# Patient Record
Sex: Female | Born: 1970 | Hispanic: Yes | Marital: Married | Smoking: Never smoker
Health system: Southern US, Community
[De-identification: ages and names within clinical notes are randomized; demographics above are authoritative.]

## PROBLEM LIST (undated history)

## (undated) DIAGNOSIS — E782 Mixed hyperlipidemia: Secondary | ICD-10-CM

## (undated) DIAGNOSIS — I10 Essential (primary) hypertension: Secondary | ICD-10-CM

## (undated) DIAGNOSIS — E119 Type 2 diabetes mellitus without complications: Secondary | ICD-10-CM

## (undated) DIAGNOSIS — E7800 Pure hypercholesterolemia, unspecified: Secondary | ICD-10-CM

## (undated) HISTORY — DX: Type 2 diabetes mellitus without complications: E11.9

## (undated) HISTORY — DX: Essential (primary) hypertension: I10

## (undated) HISTORY — DX: Mixed hyperlipidemia: E78.2

---

## 2013-02-17 ENCOUNTER — Emergency Department: Payer: Self-pay | Admitting: Emergency Medicine

## 2013-02-17 LAB — CBC WITH DIFFERENTIAL/PLATELET
Basophil #: 0.1 10*3/uL (ref 0.0–0.1)
Basophil %: 1.1 %
Eosinophil #: 0.2 10*3/uL (ref 0.0–0.7)
Eosinophil %: 3 %
HGB: 12.7 g/dL (ref 12.0–16.0)
Lymphocyte #: 2.2 10*3/uL (ref 1.0–3.6)
MCH: 27.5 pg (ref 26.0–34.0)
MCHC: 33.7 g/dL (ref 32.0–36.0)
MCV: 82 fL (ref 80–100)
Monocyte #: 0.6 x10 3/mm (ref 0.2–0.9)
Monocyte %: 8.1 %
Neutrophil %: 56.4 %
RBC: 4.63 10*6/uL (ref 3.80–5.20)
RDW: 13.6 % (ref 11.5–14.5)
WBC: 7.1 10*3/uL (ref 3.6–11.0)

## 2013-02-17 LAB — COMPREHENSIVE METABOLIC PANEL
BUN: 12 mg/dL (ref 7–18)
Co2: 28 mmol/L (ref 21–32)
Creatinine: 0.58 mg/dL — ABNORMAL LOW (ref 0.60–1.30)
EGFR (Non-African Amer.): >60
Glucose: 119 mg/dL — ABNORMAL HIGH (ref 65–99)
Osmolality: 275 (ref 275–301)
Potassium: 3.4 mmol/L — ABNORMAL LOW (ref 3.5–5.1)

## 2013-02-17 LAB — TROPONIN I: Troponin-I: <0.02 ng/mL

## 2013-02-17 LAB — CK TOTAL AND CKMB (NOT AT ARMC): CK, Total: 124 U/L (ref 21–215)

## 2013-11-20 ENCOUNTER — Other Ambulatory Visit (HOSPITAL_COMMUNITY): Payer: Self-pay | Admitting: Nurse Practitioner

## 2013-11-20 DIAGNOSIS — N6310 Unspecified lump in the right breast, unspecified quadrant: Secondary | ICD-10-CM

## 2013-12-03 ENCOUNTER — Ambulatory Visit (HOSPITAL_COMMUNITY)
Admission: RE | Admit: 2013-12-03 | Discharge: 2013-12-03 | Disposition: A | Discharge status: Home / Self Care | Payer: PRIVATE HEALTH INSURANCE | Source: Ambulatory Visit | Attending: Nurse Practitioner | Admitting: Nurse Practitioner

## 2013-12-03 ENCOUNTER — Other Ambulatory Visit (HOSPITAL_COMMUNITY): Payer: Self-pay | Admitting: Nurse Practitioner

## 2013-12-03 DIAGNOSIS — N6489 Other specified disorders of breast: Secondary | ICD-10-CM | POA: Insufficient documentation

## 2013-12-03 DIAGNOSIS — N63 Unspecified lump in unspecified breast: Secondary | ICD-10-CM | POA: Insufficient documentation

## 2013-12-03 DIAGNOSIS — N6310 Unspecified lump in the right breast, unspecified quadrant: Secondary | ICD-10-CM

## 2014-05-04 ENCOUNTER — Other Ambulatory Visit (HOSPITAL_COMMUNITY): Payer: Self-pay | Admitting: Nurse Practitioner

## 2014-05-04 DIAGNOSIS — N6310 Unspecified lump in the right breast, unspecified quadrant: Secondary | ICD-10-CM

## 2014-05-04 DIAGNOSIS — R928 Other abnormal and inconclusive findings on diagnostic imaging of breast: Secondary | ICD-10-CM

## 2014-06-09 ENCOUNTER — Ambulatory Visit (HOSPITAL_COMMUNITY)
Admission: RE | Admit: 2014-06-09 | Discharge: 2014-06-09 | Disposition: A | Discharge status: Home / Self Care | Payer: PRIVATE HEALTH INSURANCE | Source: Ambulatory Visit | Attending: Nurse Practitioner | Admitting: Nurse Practitioner

## 2014-06-09 ENCOUNTER — Other Ambulatory Visit (HOSPITAL_COMMUNITY): Payer: Self-pay | Admitting: Nurse Practitioner

## 2014-06-09 DIAGNOSIS — N6310 Unspecified lump in the right breast, unspecified quadrant: Secondary | ICD-10-CM

## 2014-06-09 DIAGNOSIS — N63 Unspecified lump in unspecified breast: Secondary | ICD-10-CM | POA: Insufficient documentation

## 2014-06-09 DIAGNOSIS — R928 Other abnormal and inconclusive findings on diagnostic imaging of breast: Secondary | ICD-10-CM

## 2014-12-08 ENCOUNTER — Other Ambulatory Visit (HOSPITAL_COMMUNITY): Payer: Self-pay | Admitting: *Deleted

## 2014-12-08 DIAGNOSIS — N6320 Unspecified lump in the left breast, unspecified quadrant: Secondary | ICD-10-CM

## 2014-12-22 ENCOUNTER — Ambulatory Visit (HOSPITAL_COMMUNITY)
Admission: RE | Admit: 2014-12-22 | Discharge: 2014-12-22 | Disposition: A | Discharge status: Home / Self Care | Payer: PRIVATE HEALTH INSURANCE | Source: Ambulatory Visit | Attending: *Deleted | Admitting: *Deleted

## 2014-12-22 ENCOUNTER — Other Ambulatory Visit (HOSPITAL_COMMUNITY): Payer: Self-pay | Admitting: *Deleted

## 2014-12-22 DIAGNOSIS — N6320 Unspecified lump in the left breast, unspecified quadrant: Secondary | ICD-10-CM

## 2014-12-22 DIAGNOSIS — N63 Unspecified lump in breast: Secondary | ICD-10-CM | POA: Insufficient documentation

## 2015-06-24 ENCOUNTER — Other Ambulatory Visit (HOSPITAL_COMMUNITY): Payer: Self-pay | Admitting: *Deleted

## 2015-06-24 DIAGNOSIS — Z09 Encounter for follow-up examination after completed treatment for conditions other than malignant neoplasm: Secondary | ICD-10-CM

## 2015-06-24 DIAGNOSIS — R928 Other abnormal and inconclusive findings on diagnostic imaging of breast: Secondary | ICD-10-CM

## 2015-07-06 ENCOUNTER — Ambulatory Visit (HOSPITAL_COMMUNITY)
Admission: RE | Admit: 2015-07-06 | Discharge: 2015-07-06 | Disposition: A | Discharge status: Home / Self Care | Payer: PRIVATE HEALTH INSURANCE | Source: Ambulatory Visit | Attending: *Deleted | Admitting: *Deleted

## 2015-07-06 DIAGNOSIS — Z09 Encounter for follow-up examination after completed treatment for conditions other than malignant neoplasm: Secondary | ICD-10-CM

## 2015-07-06 DIAGNOSIS — D242 Benign neoplasm of left breast: Secondary | ICD-10-CM | POA: Insufficient documentation

## 2015-07-06 DIAGNOSIS — R928 Other abnormal and inconclusive findings on diagnostic imaging of breast: Secondary | ICD-10-CM

## 2016-02-01 ENCOUNTER — Other Ambulatory Visit (HOSPITAL_COMMUNITY): Payer: Self-pay | Admitting: *Deleted

## 2016-02-01 DIAGNOSIS — Z09 Encounter for follow-up examination after completed treatment for conditions other than malignant neoplasm: Secondary | ICD-10-CM

## 2016-02-01 DIAGNOSIS — N6320 Unspecified lump in the left breast, unspecified quadrant: Secondary | ICD-10-CM

## 2016-02-15 ENCOUNTER — Ambulatory Visit (HOSPITAL_COMMUNITY)
Admission: RE | Admit: 2016-02-15 | Discharge: 2016-02-15 | Disposition: A | Discharge status: Home / Self Care | Payer: PRIVATE HEALTH INSURANCE | Source: Ambulatory Visit | Attending: *Deleted | Admitting: *Deleted

## 2016-02-15 ENCOUNTER — Other Ambulatory Visit: Payer: Self-pay | Admitting: Diagnostic Radiology

## 2016-02-15 DIAGNOSIS — N6320 Unspecified lump in the left breast, unspecified quadrant: Secondary | ICD-10-CM

## 2016-02-15 DIAGNOSIS — N63 Unspecified lump in breast: Secondary | ICD-10-CM | POA: Insufficient documentation

## 2016-02-15 DIAGNOSIS — Z09 Encounter for follow-up examination after completed treatment for conditions other than malignant neoplasm: Secondary | ICD-10-CM

## 2016-02-15 DIAGNOSIS — R928 Other abnormal and inconclusive findings on diagnostic imaging of breast: Secondary | ICD-10-CM

## 2016-02-16 ENCOUNTER — Other Ambulatory Visit: Payer: Self-pay | Admitting: *Deleted

## 2016-02-16 DIAGNOSIS — R928 Other abnormal and inconclusive findings on diagnostic imaging of breast: Secondary | ICD-10-CM

## 2016-02-18 ENCOUNTER — Ambulatory Visit
Admission: RE | Admit: 2016-02-18 | Discharge: 2016-02-18 | Disposition: A | Discharge status: Home / Self Care | Payer: No Typology Code available for payment source | Source: Ambulatory Visit | Attending: Diagnostic Radiology | Admitting: Diagnostic Radiology

## 2016-02-18 ENCOUNTER — Ambulatory Visit
Admission: RE | Admit: 2016-02-18 | Discharge: 2016-02-18 | Disposition: A | Discharge status: Home / Self Care | Payer: No Typology Code available for payment source | Source: Ambulatory Visit | Attending: *Deleted | Admitting: *Deleted

## 2016-02-18 ENCOUNTER — Other Ambulatory Visit: Payer: Self-pay | Admitting: *Deleted

## 2016-02-18 DIAGNOSIS — R928 Other abnormal and inconclusive findings on diagnostic imaging of breast: Secondary | ICD-10-CM

## 2016-03-10 ENCOUNTER — Other Ambulatory Visit (HOSPITAL_COMMUNITY): Payer: Self-pay | Admitting: Orthopedic Surgery

## 2016-03-10 DIAGNOSIS — D499 Neoplasm of unspecified behavior of unspecified site: Secondary | ICD-10-CM

## 2016-03-13 NOTE — H&P (Signed)
  NTS SOAP Note  Vital Signs:  Vitals as of: [id number]: Systolic Q000111Q: Diastolic 89: Heart Rate 76: Temp 97.49F (Temporal): Height 70ft 3.5in: Weight 161Lbs 0 Ounces: BMI 28.07   BMI : 28.07 kg/m2  Subjective: This 45 year old female presents for of a left breast mass/microcalcifications.  Seen on screening mammmogram, core biopsy shows sclerosing lesion.  Removal is recommended.  Patient denies feeling a lump.  No nipple discharge, family h/o breast cancer.  Does understand some [place], daughter present with her.  Review of Symptoms:  Constitutional:negative Head:negative Eyes:negative Nose/Mouth/Throat:negative Cardiovascular:negative Respiratory:negative Gastrointestinnegative Genitourinary:negative Musculoskeletal:negative Skin:negative Breast:negative Hematolgic/Lymphatic:negative Allergic/Immunologic:negative   Past Medical History:Reviewed  Past Medical History  Surgical History: none Medical Problems: NIDDM, HTN Allergies: nkda Medications: metformin, lisinopril, atenolol   Social History:Reviewed  Social History  Preferred Language: Other Race:  Other Ethnicity: Not Hispanic / Latino Age: 89 year Marital Status:  S Alcohol: no   Smoking Status: Never smoker reviewed on 03/09/2016 Functional Status reviewed on 03/09/2016 ------------------------------------------------ Bathing: Normal Cooking: Normal Dressing: Normal Driving: Normal Eating: Normal Managing Meds: Normal Oral Care: Normal Shopping: Normal Toileting: Normal Transferring: Normal Walking: Normal Cognitive Status reviewed on 03/09/2016 ------------------------------------------------ Attention: Normal Decision Making: Normal Language: Normal Memory: Normal Motor: Normal Perception: Normal Problem Solving: Normal Visual and Spatial: Normal   Family History:Reviewed  Family Health History Mother, Living; Hypertension (high blood pressure);  Father,  Living; Hypertension (high blood pressure);     Objective Information: General:Well appearing, well nourished in no distress. Skin:no rash or prominent lesions Head:Atraumatic; no masses; no abnormalities Neck:Supple without lymphadenopathy.  Heart:RRR, no murmur or gallop.  Normal S1, S2.  No S3, S4.  Lungs:CTA bilaterally, no wheezes, rhonchi, rales.  Breathing unlabored. No dominant mass, nipple discharge, dimpling in either breast.  Axillas negative for palpable nodes. Abdomen:Soft, NT/ND, no HSM, no masses. negative  Mammogram and path reports reviewed.  Discussed with Dr. Saralyn Pilar, pathology Assessment:Left breast calcifications, uncertain behavior  Diagnoses: 793.89  R92.1 Mammographic calcification of breast (Mammographic calcification found on diagnostic imaging of breast)  Procedures: [id number] - OFFICE OUTPATIENT NEW 30 MINUTES    Plan:  Scheduled for left breast biopsy after needle localization on 03/22/16.   Patient Education:Alternative treatments to surgery were discussed with patient (and family).Risks and benefits  of procedure were fully explained to the patient (and family) who gave informed consent. Patient/family questions were addressed.  Follow-up:Pending Surgery

## 2016-03-15 NOTE — Patient Instructions (Addendum)
Chelsea Massey  03/15/2016     @PREFPERIOPPHARMACY @   Your procedure is scheduled on 03/22/2016.  Report to [facility] at 8:30 A.M.  Call this number if you have problems the morning of surgery:  [phone]   Remember:  Do not eat food or drink liquids after midnight.  Take these medicines the morning of surgery with A SIP OF WATER : ATENOLOL AND LISINOPRIL   Do not wear jewelry, make-up or nail polish.  Do not wear lotions, powders, or perfumes.  You may wear deodorant.  Do not shave 48 hours prior to surgery.  Men may shave face and neck.  Do not bring valuables to the hospital.  [facility] is not responsible for any belongings or valuables.  Contacts, dentures or bridgework may not be worn into surgery.  Leave your suitcase in the car.  After surgery it may be brought to your room.  For patients admitted to the hospital, discharge time will be determined by your treatment team.  Patients discharged the day of surgery will not be allowed to drive home.   Name and phone number of your driver:   FAMILY Special instructions:  N/A  Please read over the following fact sheets that you were given. Care and Recovery After Surgery    General Anesthesia, Adult General anesthesia is a sleep-like state of non-feeling produced by medicines (anesthetics). General anesthesia prevents you from being alert and feeling pain during a medical procedure. Your caregiver may recommend general anesthesia if your procedure:  Is long.  Is painful or uncomfortable.  Would be frightening to see or hear.  Requires you to be still.  Affects your breathing.  Causes significant blood loss. LET YOUR CAREGIVER KNOW ABOUT:  Allergies to food or medicine.  Medicines taken, including vitamins, herbs, eyedrops, over-the-counter medicines, and creams.  Use of steroids (by mouth or creams).  Previous problems with anesthetics or numbing medicines, including problems experienced by  relatives.  History of bleeding problems or blood clots.  Previous surgeries and types of anesthetics received.  Possibility of pregnancy, if this applies.  Use of cigarettes, alcohol, or illegal drugs.  Any health condition(s), especially diabetes, sleep apnea, and high blood pressure. RISKS AND COMPLICATIONS General anesthesia rarely causes complications. However, if complications do occur, they can be life threatening. Complications include:  A lung infection.  A stroke.  A heart attack.  Waking up during the procedure. When this occurs, the patient may be unable to move and communicate that he or she is awake. The patient may feel severe pain. Older adults and adults with serious medical problems are more likely to have complications than adults who are young and healthy. Some complications can be prevented by answering all of your caregiver's questions thoroughly and by following all pre-procedure instructions. It is important to tell your caregiver if any of the pre-procedure instructions, especially those related to diet, were not followed. Any food or liquid in the stomach can cause problems when you are under general anesthesia. BEFORE THE PROCEDURE  Ask your caregiver if you will have to spend the night at the hospital. If you will not have to spend the night, arrange to have an adult drive you and stay with you for 24 hours.  Follow your caregiver's instructions if you are taking dietary supplements or medicines. Your caregiver may tell you to stop taking them or to reduce your dosage.  Do not smoke for as long as possible before your procedure. If possible, stop  smoking 3-6 weeks before the procedure.  Do not take new dietary supplements or medicines within 1 week of your procedure unless your caregiver approves them.  Do not eat within 8 hours of your procedure or as directed by your caregiver. Drink only clear liquids, such as water, black coffee (without milk or cream),  and fruit juices (without pulp).  Do not drink within 3 hours of your procedure or as directed by your caregiver.  You may brush your teeth on the morning of the procedure, but make sure to spit out the toothpaste and water when finished. PROCEDURE  You will receive anesthetics through a mask, through an intravenous (IV) access tube, or through both. A doctor who specializes in anesthesia (anesthesiologist) or a nurse who specializes in anesthesia (nurse anesthetist) or both will stay with you throughout the procedure to make sure you remain unconscious. He or she will also watch your blood pressure, pulse, and oxygen levels to make sure that the anesthetics do not cause any problems. Once you are asleep, a breathing tube or mask may be used to help you breathe. AFTER THE PROCEDURE You will wake up after the procedure is complete. You may be in the room where the procedure was performed or in a recovery area. You may have a sore throat if a breathing tube was used. You may also feel:  Dizzy.  Weak.  Drowsy.  Confused.  Nauseous.  Cold. These are all normal responses and can be expected to last for up to 24 hours after the procedure is complete. A caregiver will tell you when you are ready to go home. This will usually be when you are fully awake and in stable condition.   This information is not intended to replace advice given to you by your health care provider. Make sure you discuss any questions you have with your health care provider.   Document Released: 01/02/2008 Document Revised: 10/16/2014 Document Reviewed: 01/24/2012 Elsevier Interactive Patient Education 2016 [place] (Breast Biopsy) Una biopsia de mama es un procedimiento en el cual se extrae Truddie Coco de tejido de la mama. El tejido se examina bajo el microscopio para determinar si hay clulas cancerosas. Una biopsia de mama se realiza cuando hay:  Un bulto en la mama no diagnosticado  (tumor).  Anormalidades, hundimientos, costras o ulceraciones en el pezn.  Secrecin anormal del pezn, Dealer.  Enrojecimiento, hinchazn y [place].  Depsitos de calcio (calcificaciones) o anormalidades observadas en Lavinia Sharps, ecografa, o en la resonancia magntica (IRM).  Cambios sospechosos en la mama que se observan en la mamografa. Si se descubre que el tumor es canceroso (maligno) una biopsia de mama puede ayudar a Teacher, adult education cul es el mejor tratamiento para usted. Hay diferentes tipos de biopsia de mama. Hable con su mdico acerca de las opciones y cul es la mejor para usted.  INFORME A SU MDICO SOBRE:   Alergias a alimentos o medicamentos.  Medicamentos que [place], incluyendo vitaminas, hierbas, gotas oftlmicas, medicamentos de venta libre y cremas.  Uso de corticoides (por va oral o cremas).  Problemas anteriores debido a anestsicos o a medicamentos que [employer] sensibilidad.  Antecedentes de hemorragias o cogulos sanguneos.  Cirugas anteriores.  Otros problemas de salud, incluyendo diabetes y problemas renales.  Resfros o infecciones recientes.  Posibilidad de embarazo, si corresponde. [place].  Infeccin.  Reaccin alrgica a los medicamentos.  Hematomas e inflamacin de la mama.  Alteracin  en la forma de la mama.  No se encuentra el bulto o la anormalidad.  Necesidad de [place]. ANTES DEL PROCEDIMIENTO   Pdale a alguna persona que la lleve a su casa luego del procedimiento.  No fume al menos las 2 semanas previas al procedimiento. Si fuma, abandone el hbito.  No beba alcohol durante las 24 horas previas al procedimiento.  Use un buen sostn para el procedimiento.  El mdico puede realizar un procedimiento para Glass blower/designer un alambre (marcacin con Management consultant) o de una semilla que emite radiacin (marcacin con semilla radiactiva) en el ndulo mamario. Durante este  procedimiento, se realiza una mamografa o una ecografa para ayudar a Marine scientist. El alambre o la semilla ayudarn al mdico a Pension scheme manager el ndulo cuando realiza la biopsia, especialmente si este no se palpable. PROCEDIMIENTO  Le administrarn medicamentos para adormecer el rea de la mama (anestesia local) o medicamentos para dormir durante el procedimiento (anestesia general). Los siguientes son los diferentes tipos de biopsia que se pueden Optometrist.   Aspiracin con aguja fina: se inserta una aguja delgada con Clent Jacks en el tumor de la mama. Con ella se extraen lquido y clulas que luego se observan en el microscopio. Si el tumor no se palpa, se realizar una ecografa para localizar el tumor y Catering manager en el rea correcta.   Biopsia con aguja gruesa: Maxwell Caul de seccin amplia (aguja gruesa) se inserta en el tumor entre 3 y 36 veces para obtener muestras de tejido. Se toma la muestra de tejido. La aguja se Water quality scientist correcto mediante el uso de una ecografa o una radiografa.   Biopsia estereotctica: se utilizan equipos radiogrficos y Ardelia Mems computadora para Physiological scientist imgenes de la tumoracin [place]. La computadora encuentra exactamente el ncleo en el que se debe insertar la aguja. Se toma una muestra de tejido.   Biopsia asistida por vaco: se realiza una pequea incisin (menos de  de pulgada [0,6 cm] ) en la mama. Un equipo para biopsia que incluye una [place] y un suctor se pasan a travs de la incisin dentro del tejido [place]. El suctor suavemente drena tejido mamario anormal hacia la aguja para extraerlo. Este tipo de biopsia extrae una muestra mayor de tejido que aquel que se extrae habitualmente con la biopsia con Oletta Lamas. No se necesitan puntos de sutura y habitualmente deja una cicatriz pequea.  Biopsia con aguja gruesa guiada por ultrasonido-Un ultrasonido de alta frecuencia gua a la aguja gruesa al rea de la masa o anormalidad. Se  hace una incisin para insertar la aguja. Se toma una muestra de tejido.  Biopsia abierta: se hace una incisin grande en la mama. El mdico intentar extirpar todo el tumor de la mama o todo lo que pueda. DESPUS DEL PROCEDIMIENTO   La conducirn a la zona de recuperacin. Cuando se encuentre bien y no tenga problemas, podr volver a Medical illustrator.  Podr notar hematomas en la mama. Esto es normal.  El mdico puede aplicar un vendaje compresivo sobre la mama durante 24 - 46 horas. El vendaje compresivo se ajuste de manera apretada alrededor del trax para evitar que se acumule lquido debajo de los tejidos.   Esta informacin no tiene Marine scientist el consejo del mdico. Asegrese de hacerle al mdico cualquier pregunta que tenga.   Document Released: 07/05/2005 Document Revised: 06/16/2015 Elsevier Interactive Patient Education 2016 [employer]. Chlorhexidine topical antiseptic Qu es este medicamento? La CLORHEXIDINA se utiliza para la  limpieza de la piel con heridas y para la piel en general. Este medicamento se utiliza tambin como un limpiador para las manos antes de una operacin y para ayudar a prevenir infecciones. Este medicamento puede ser utilizado para otros usos; si tiene alguna pregunta consulte con su proveedor de atencin mdica o con su farmacutico. Qu le debo informar a mi profesional de la salud antes de tomar este medicamento? Necesita saber si usted presenta alguno de los siguientes problemas o situaciones -cualquier erupcin o problema cutneo -una reaccin alrgica o inusual a la clorhexidina, a otros medicamentos, alimentos, colorantes o conservantes -si est embarazada o buscando quedar embarazada -si est amamantando a un beb Cmo debo utilizar este medicamento? Este medicamento es slo para uso externo. No lo tome por va oral. Siga las instrucciones de la etiqueta del medicamento o como le haya indicado su mdico o su profesional de la salud. Mantngalo  fuera de los ojos, odos y [place]. Este medicamento no se debe usar como una preparacin de la piel antes de una operacin de la cara o la cabeza. Hable con su pediatra para informarse acerca del uso de este medicamento en nios. Aunque este medicamento se puede recetar para condiciones selectivas, las precauciones se aplican. Sobredosis: Pngase en contacto inmediatamente con un centro toxicolgico o una sala de urgencia si usted cree que haya tomado demasiado medicamento. ATENCIN: [employer] es solo para usted. No comparta este medicamento con nadie. Qu sucede si me olvido de una dosis? No se aplica en este caso. Este medicamento no es para uso regular. Qu puede interactuar con este medicamento? No se esperan interacciones. Puede ser que esta lista no menciona todas las posibles interacciones. Informe a su profesional de [employer]	[place] de [employer] productos a base de hierbas, medicamentos de [place] o suplementos nutritivos que est tomando. Si usted fuma, consume bebidas alcohlicas o si utiliza drogas ilegales, indqueselo tambin a su profesional de [employer]	[place]. Algunas sustancias pueden interactuar con su medicamento. A qu debo estar atento al usar [employer]? Este medicamento puede causarle reacciones alrgicas que pueden ser severas. Comunquese inmediatamente con su profesional de la salud si tiene Secondary school teacher. No tome este medicamento por va oral. Evite que el medicamento entre en contacto con sus odos y ojos. Si esto ocurre, enjuguelos con abundante agua fra del grifo. Qu efectos secundarios puedo tener al [employer] este medicamento? Efectos secundarios que debe informar a su mdico o a Office manager de la salud tan pronto como sea posible: -Chief of Staff como erupcin cutnea, picazn o urticarias, hinchazn de la cara, labios o lengua -problemas respiratorios -tos Efectos secundarios que, por lo general, no requieren atencin mdica (debe  informarlos a su mdico o a Office manager de la salud si persisten o si son molestos): -aumento de la sensibilidad al sol -irritacin de la piel Puede ser que esta lista no menciona todos los posibles efectos secundarios. Comunquese a su mdico por asesoramiento mdico [employer]. Usted puede informar los efectos secundarios a la FDA por telfono al 1-800-FDA-1088. Dnde debo guardar mi medicina? Mantenga el medicamento fuera del alcance de los nios. Gurdela a [employer], entre 15 y 26 grados C (26 y 98 grados F). Gurdela lejos de la luz directa y del Freight forwarder. No la congele. Deseche todo el medicamento que no haya utilizado, despus de la fecha de vencimiento. ATENCIN: Este folleto es un resumen. Puede ser que no cubra toda la posible informacin. Si usted tiene Engineer, drilling de  esta medicina, consulte con su mdico, su farmacutico o su profesional de [employer]	[place].    2016, Elsevier/Gold Standard. (2014-11-17 00:00:00)

## 2016-03-16 ENCOUNTER — Other Ambulatory Visit: Payer: Self-pay

## 2016-03-16 ENCOUNTER — Ambulatory Visit (HOSPITAL_COMMUNITY)
Admission: RE | Admit: 2016-03-16 | Discharge: 2016-03-16 | Disposition: A | Discharge status: Home / Self Care | Payer: PRIVATE HEALTH INSURANCE | Source: Ambulatory Visit | Attending: General Surgery | Admitting: General Surgery

## 2016-03-16 ENCOUNTER — Encounter (HOSPITAL_COMMUNITY): Payer: Self-pay

## 2016-03-16 ENCOUNTER — Encounter (HOSPITAL_COMMUNITY)
Admission: RE | Admit: 2016-03-16 | Discharge: 2016-03-16 | Disposition: A | Discharge status: Home / Self Care | Payer: PRIVATE HEALTH INSURANCE | Source: Ambulatory Visit | Attending: General Surgery | Admitting: General Surgery

## 2016-03-16 DIAGNOSIS — R918 Other nonspecific abnormal finding of lung field: Secondary | ICD-10-CM | POA: Insufficient documentation

## 2016-03-16 DIAGNOSIS — I10 Essential (primary) hypertension: Secondary | ICD-10-CM | POA: Insufficient documentation

## 2016-03-16 DIAGNOSIS — Z01818 Encounter for other preprocedural examination: Secondary | ICD-10-CM | POA: Insufficient documentation

## 2016-03-16 HISTORY — DX: Type 2 diabetes mellitus without complications: E11.9

## 2016-03-16 HISTORY — DX: Essential (primary) hypertension: I10

## 2016-03-16 HISTORY — DX: Pure hypercholesterolemia, unspecified: E78.00

## 2016-03-16 LAB — CBC WITH DIFFERENTIAL/PLATELET
BASOS ABS: 0 10*3/uL (ref 0.0–0.1)
BASOS PCT: 0 %
EOS ABS: 0.3 10*3/uL (ref 0.0–0.7)
Eosinophils Relative: 3 %
HCT: 39.4 % (ref 36.0–46.0)
HEMOGLOBIN: 13.2 g/dL (ref 12.0–15.0)
LYMPHS ABS: 3.1 10*3/uL (ref 0.7–4.0)
Lymphocytes Relative: 33 %
MCH: 28.9 pg (ref 26.0–34.0)
MCHC: 33.5 g/dL (ref 30.0–36.0)
MCV: 86.4 fL (ref 78.0–100.0)
Monocytes Absolute: 0.6 10*3/uL (ref 0.1–1.0)
Monocytes Relative: 7 %
NEUTROS PCT: 57 %
Neutro Abs: 5.2 10*3/uL (ref 1.7–7.7)
Platelets: 263 10*3/uL (ref 150–400)
RBC: 4.56 MIL/uL (ref 3.87–5.11)
RDW: 13.9 % (ref 11.5–15.5)
WBC: 9.1 10*3/uL (ref 4.0–10.5)

## 2016-03-16 LAB — BASIC METABOLIC PANEL
Anion gap: 8 (ref 5–15)
BUN: 17 mg/dL (ref 6–20)
CHLORIDE: 104 mmol/L (ref 101–111)
CO2: 24 mmol/L (ref 22–32)
CREATININE: 0.68 mg/dL (ref 0.44–1.00)
Calcium: 9.5 mg/dL (ref 8.9–10.3)
GFR calc non Af Amer: >60 mL/min (ref >60)
Glucose, Bld: 127 mg/dL — ABNORMAL HIGH (ref 65–99)
Potassium: 3.6 mmol/L (ref 3.5–5.1)
SODIUM: 136 mmol/L (ref 135–145)

## 2016-03-16 LAB — HCG, SERUM, QUALITATIVE: Preg, Serum: NEGATIVE

## 2016-03-16 NOTE — Pre-Procedure Instructions (Signed)
Patient given information to sign up for my chart at home. 

## 2016-03-22 ENCOUNTER — Ambulatory Visit (HOSPITAL_COMMUNITY): Payer: PRIVATE HEALTH INSURANCE | Admitting: Anesthesiology

## 2016-03-22 ENCOUNTER — Ambulatory Visit (HOSPITAL_COMMUNITY)
Admission: RE | Admit: 2016-03-22 | Discharge: 2016-03-22 | Disposition: A | Discharge status: Home / Self Care | Payer: PRIVATE HEALTH INSURANCE | Source: Ambulatory Visit | Attending: General Surgery | Admitting: General Surgery

## 2016-03-22 ENCOUNTER — Encounter (HOSPITAL_COMMUNITY)
Admission: RE | Disposition: A | Discharge status: Home / Self Care | Payer: Self-pay | Source: Ambulatory Visit | Attending: General Surgery

## 2016-03-22 ENCOUNTER — Other Ambulatory Visit (HOSPITAL_COMMUNITY): Payer: Self-pay | Admitting: General Surgery

## 2016-03-22 ENCOUNTER — Ambulatory Visit (HOSPITAL_COMMUNITY)
Admission: RE | Admit: 2016-03-22 | Discharge: 2016-03-22 | Disposition: A | Discharge status: Home / Self Care | Payer: PRIVATE HEALTH INSURANCE | Source: Ambulatory Visit | Attending: Orthopedic Surgery | Admitting: Orthopedic Surgery

## 2016-03-22 ENCOUNTER — Encounter (HOSPITAL_COMMUNITY): Payer: Self-pay | Admitting: *Deleted

## 2016-03-22 DIAGNOSIS — D242 Benign neoplasm of left breast: Secondary | ICD-10-CM | POA: Diagnosis present

## 2016-03-22 DIAGNOSIS — E119 Type 2 diabetes mellitus without complications: Secondary | ICD-10-CM | POA: Diagnosis not present

## 2016-03-22 DIAGNOSIS — R922 Inconclusive mammogram: Secondary | ICD-10-CM

## 2016-03-22 DIAGNOSIS — N6092 Unspecified benign mammary dysplasia of left breast: Secondary | ICD-10-CM | POA: Diagnosis not present

## 2016-03-22 DIAGNOSIS — Z7984 Long term (current) use of oral hypoglycemic drugs: Secondary | ICD-10-CM | POA: Diagnosis not present

## 2016-03-22 DIAGNOSIS — I10 Essential (primary) hypertension: Secondary | ICD-10-CM | POA: Insufficient documentation

## 2016-03-22 DIAGNOSIS — Z79899 Other long term (current) drug therapy: Secondary | ICD-10-CM | POA: Insufficient documentation

## 2016-03-22 DIAGNOSIS — D499 Neoplasm of unspecified behavior of unspecified site: Secondary | ICD-10-CM

## 2016-03-22 HISTORY — PX: BREAST BIOPSY: SHX20

## 2016-03-22 LAB — GLUCOSE, CAPILLARY: Glucose-Capillary: 102 mg/dL — ABNORMAL HIGH (ref 65–99)

## 2016-03-22 SURGERY — BREAST BIOPSY WITH NEEDLE LOCALIZATION
Anesthesia: General | Laterality: Left

## 2016-03-22 MED ORDER — KETOROLAC TROMETHAMINE 30 MG/ML IJ SOLN
30.0000 mg | Freq: Once | INTRAMUSCULAR | Status: AC
  Administered 2016-03-22: 30 mg via INTRAVENOUS
  Filled 2016-03-22: qty 1

## 2016-03-22 MED ORDER — BUPIVACAINE HCL (PF) 0.5 % IJ SOLN
INTRAMUSCULAR | Status: AC
  Filled 2016-03-22: qty 30

## 2016-03-22 MED ORDER — BUPIVACAINE HCL (PF) 0.5 % IJ SOLN
INTRAMUSCULAR | Status: DC | PRN
  Administered 2016-03-22: 7 mL

## 2016-03-22 MED ORDER — MIDAZOLAM HCL 5 MG/5ML IJ SOLN
INTRAMUSCULAR | Status: DC | PRN
  Administered 2016-03-22: 2 mg via INTRAVENOUS

## 2016-03-22 MED ORDER — CEFAZOLIN SODIUM-DEXTROSE 2-4 GM/100ML-% IV SOLN
2.0000 g | INTRAVENOUS | Status: AC
  Administered 2016-03-22: 2 g via INTRAVENOUS
  Filled 2016-03-22: qty 100

## 2016-03-22 MED ORDER — MIDAZOLAM HCL 2 MG/2ML IJ SOLN
INTRAMUSCULAR | Status: AC
  Filled 2016-03-22: qty 2

## 2016-03-22 MED ORDER — ONDANSETRON HCL 4 MG/2ML IJ SOLN: 4.0000 mg | Freq: Once | INTRAMUSCULAR | Status: DC | PRN

## 2016-03-22 MED ORDER — CHLORHEXIDINE GLUCONATE 4 % EX LIQD: 1.0000 "application " | Freq: Once | CUTANEOUS | Status: DC

## 2016-03-22 MED ORDER — FENTANYL CITRATE (PF) 100 MCG/2ML IJ SOLN
INTRAMUSCULAR | Status: DC | PRN
  Administered 2016-03-22 (×2): 50 ug via INTRAVENOUS

## 2016-03-22 MED ORDER — FENTANYL CITRATE (PF) 250 MCG/5ML IJ SOLN
INTRAMUSCULAR | Status: AC
  Filled 2016-03-22: qty 5

## 2016-03-22 MED ORDER — LIDOCAINE HCL 1 % IJ SOLN
INTRAMUSCULAR | Status: DC | PRN
  Administered 2016-03-22: 30 mg via INTRADERMAL

## 2016-03-22 MED ORDER — MIDAZOLAM HCL 2 MG/2ML IJ SOLN
1.0000 mg | INTRAMUSCULAR | Status: DC | PRN
  Administered 2016-03-22: 2 mg via INTRAVENOUS

## 2016-03-22 MED ORDER — PROPOFOL 10 MG/ML IV BOLUS
INTRAVENOUS | Status: DC | PRN
  Administered 2016-03-22: 200 mg via INTRAVENOUS

## 2016-03-22 MED ORDER — ONDANSETRON HCL 4 MG/2ML IJ SOLN
INTRAMUSCULAR | Status: AC
  Filled 2016-03-22: qty 2

## 2016-03-22 MED ORDER — FENTANYL CITRATE (PF) 100 MCG/2ML IJ SOLN: 25.0000 ug | INTRAMUSCULAR | Status: DC | PRN

## 2016-03-22 MED ORDER — ONDANSETRON HCL 4 MG/2ML IJ SOLN
4.0000 mg | Freq: Once | INTRAMUSCULAR | Status: AC
  Administered 2016-03-22: 4 mg via INTRAVENOUS

## 2016-03-22 MED ORDER — HYDROCODONE-ACETAMINOPHEN 5-325 MG PO TABS: 1.0000 | ORAL_TABLET | ORAL | Status: AC | PRN

## 2016-03-22 MED ORDER — LACTATED RINGERS IV SOLN
INTRAVENOUS | Status: DC
  Administered 2016-03-22: 10:00:00 via INTRAVENOUS

## 2016-03-22 SURGICAL SUPPLY — 32 items
BAG HAMPER (MISCELLANEOUS) ×3 IMPLANT
BLADE SURG 15 STRL LF DISP TIS (BLADE) IMPLANT
BLADE SURG 15 STRL SS (BLADE)
CHLORAPREP W/TINT 26ML (MISCELLANEOUS) ×3 IMPLANT
CLOTH BEACON ORANGE TIMEOUT ST (SAFETY) ×3 IMPLANT
COVER LIGHT HANDLE STERIS (MISCELLANEOUS) ×6 IMPLANT
DECANTER SPIKE VIAL GLASS SM (MISCELLANEOUS) ×3 IMPLANT
DERMABOND ADVANCED (GAUZE/BANDAGES/DRESSINGS) ×2
DERMABOND ADVANCED .7 DNX12 (GAUZE/BANDAGES/DRESSINGS) ×1 IMPLANT
ELECT REM PT RETURN 9FT ADLT (ELECTROSURGICAL) ×3
ELECTRODE REM PT RTRN 9FT ADLT (ELECTROSURGICAL) ×1 IMPLANT
FORMALIN 10 PREFIL 120ML (MISCELLANEOUS) IMPLANT
GLOVE BIOGEL PI IND STRL 7.0 (GLOVE) ×3 IMPLANT
GLOVE BIOGEL PI INDICATOR 7.0 (GLOVE) ×6
GLOVE SURG SS PI 7.5 STRL IVOR (GLOVE) ×3 IMPLANT
GOWN STRL REUS W/TWL LRG LVL3 (GOWN DISPOSABLE) ×6 IMPLANT
KIT ROOM TURNOVER APOR (KITS) ×3 IMPLANT
LIQUID BAND (GAUZE/BANDAGES/DRESSINGS) IMPLANT
MANIFOLD NEPTUNE II (INSTRUMENTS) ×3 IMPLANT
MESH VENTRALEX ST 8CM LRG (Mesh General) ×3 IMPLANT
NEEDLE HYPO 18GX1.5 BLUNT FILL (NEEDLE) IMPLANT
NEEDLE HYPO 25X1 1.5 SAFETY (NEEDLE) ×3 IMPLANT
NS IRRIG 1000ML POUR BTL (IV SOLUTION) ×3 IMPLANT
PACK MINOR (CUSTOM PROCEDURE TRAY) ×3 IMPLANT
PAD ARMBOARD 7.5X6 YLW CONV (MISCELLANEOUS) ×3 IMPLANT
PENCIL HANDSWITCHING (ELECTRODE) ×6 IMPLANT
SET BASIN LINEN APH (SET/KITS/TRAYS/PACK) ×3 IMPLANT
SUT SILK 2 0 SH (SUTURE) ×3 IMPLANT
SUT VIC AB 3-0 SH 27 (SUTURE) ×2
SUT VIC AB 3-0 SH 27X BRD (SUTURE) ×1 IMPLANT
SUT VIC AB 4-0 PS2 27 (SUTURE) ×3 IMPLANT
SYR CONTROL 10ML LL (SYRINGE) ×3 IMPLANT

## 2016-03-22 NOTE — Anesthesia Procedure Notes (Signed)
Procedure Name: LMA Insertion Date/Time: 03/22/2016 10:29 AM Performed by: Charmaine Downs Pre-anesthesia Checklist: Patient identified, Emergency Drugs available, Suction available and Patient being monitored Patient Re-evaluated:Patient Re-evaluated prior to inductionOxygen Delivery Method: Circle system utilized Preoxygenation: Pre-oxygenation with 100% oxygen Intubation Type: IV induction Ventilation: Mask ventilation without difficulty LMA: LMA inserted LMA Size: 3.0 Grade View: Grade III Number of attempts: 2 Placement Confirmation: ETT inserted through vocal cords under direct vision,  positive ETCO2 and breath sounds checked- equal and bilateral Tube secured with: Tape Dental Injury: Teeth and Oropharynx as per pre-operative assessment

## 2016-03-22 NOTE — Anesthesia Postprocedure Evaluation (Signed)
Anesthesia Post Note  Patient: Chelsea Massey  Procedure(s) Performed: Procedure(s) (LRB): BREAST BIOPSY WITH NEEDLE LOCALIZATION (Left)  Patient location during evaluation: PACU Anesthesia Type: General Level of consciousness: awake, oriented and patient cooperative Pain management: pain level controlled Vital Signs Assessment: post-procedure vital signs reviewed and stable Respiratory status: spontaneous breathing, nonlabored ventilation and respiratory function stable Cardiovascular status: blood pressure returned to baseline Postop Assessment: no signs of nausea or vomiting Anesthetic complications: no    Last Vitals:  Filed Vitals:   03/22/16 1005 03/22/16 1124  BP: 143/78 129/79  Pulse:  67  Temp:  36.7 C  Resp: 20 20    Last Pain: There were no vitals filed for this visit.               Donyale Falcon J

## 2016-03-22 NOTE — Anesthesia Preprocedure Evaluation (Signed)
Anesthesia Evaluation  Patient identified by MRN, date of birth, ID band Patient awake    Reviewed: Allergy & Precautions, NPO status , Patient's Chart, lab work & pertinent test results, reviewed documented beta blocker date and time   Airway Mallampati: III  TM Distance: >3 FB     Dental  (+) Teeth Intact   Pulmonary neg pulmonary ROS,    breath sounds clear to auscultation       Cardiovascular hypertension, Pt. on medications and Pt. on home beta blockers  Rhythm:Regular Rate:Normal     Neuro/Psych    GI/Hepatic negative GI ROS,   Endo/Other  diabetes, Type 2, Oral Hypoglycemic Agents  Renal/GU      Musculoskeletal   Abdominal   Peds  Hematology   Anesthesia Other Findings   Reproductive/Obstetrics                             Anesthesia Physical Anesthesia Plan  ASA: II  Anesthesia Plan: General   Post-op Pain Management:    Induction: Intravenous  Airway Management Planned: LMA  Additional Equipment:   Intra-op Plan:   Post-operative Plan: Extubation in OR  Informed Consent: I have reviewed the patients History and Physical, chart, labs and discussed the procedure including the risks, benefits and alternatives for the proposed anesthesia with the patient or authorized representative who has indicated his/her understanding and acceptance.     Plan Discussed with:   Anesthesia Plan Comments:         Anesthesia Quick Evaluation

## 2016-03-22 NOTE — Op Note (Signed)
Patient:  Chelsea Massey  DOB:  1970/11/07  MRN:  [id number]   Preop Diagnosis:  Left breast neoplasm, unspecified  Postop Diagnosis:  Same  Procedure:  Left breast biopsy after needle localization  Surgeon:  Aviva Signs, M.D.  Anes:  Gen.  Indications:  Patient is a 45 year old Hispanic female who presents with a sclerosing lesion in the left breast. Given the core biopsy findings, it has been recommended that the area be removed. The risks and benefits of the procedure including bleeding and infection were fully explained to the patient through an interpreter, who gave informed consent.  Procedure note:  The patient was placed the supine position after undergoing needle localization in the radiology department. After general anesthesia was administered, the left breast was prepped and draped using the usual sterile technique with DuraPrep. Surgical site confirmation was performed.  The guidewire was noted in the upper, outer quadrant. Incision was made in this region to include the guidewire. The guidewire was then followed down to the area of concern. This was excised in total without difficulty. It was sent to radiology. Specimen radiography confirmed the clip and suspicious lesion to be within the specimen removed. It was then sent to pathology further examination. A bleeding was controlled using Bovie electrocautery. 0.5% Sensorcaine was instilled into the surrounding wound. The skin was closed using a 4 Vicryl subcuticular suture. Dermabond was then applied.  All tape and needle counts were correct at the end of the procedure. Patient was awakened and transferred to PACU in stable condition.  Complications:  None  EBL:  Minimal  Specimen:  Left breast tissue

## 2016-03-22 NOTE — Discharge Instructions (Signed)
Biopsia de mama - Cuidados posteriores  (Breast Biopsy, Care After)  Siga estas instrucciones durante las prximas semanas. Estas indicaciones le proporcionan informacin general acerca de cmo deber cuidarse despus del procedimiento. El mdico tambin podr darle instrucciones ms especficas. El tratamiento ha sido planificado segn las prcticas mdicas actuales, pero en algunos casos pueden ocurrir problemas. Comunquese con el mdico si tiene algn problema o tiene preguntas despus del procedimiento. INSTRUCCIONES PARA EL CUIDADO EN EL HOGAR   Solo tome medicamentos de venta libre o recetados para Conservation officer, historic buildings, Tree surgeon o fiebre, segn las indicaciones del mdico.  No tome aspirina. Puede ocasionar hemorragias.  Mantenga las suturas (puntos) secos cuando se bae.  Proteja la zona de la biopsia. No deje que la zona se inflame.  Evite las actividades que podran tironear y abrir el sitio de la biopsia hasta que su mdico la autorice. Aqu se incluye elongacin, estiramientos, actividad fsica, deportes o levantar pesos de ms de 3 libras (1.3 kg).  Siga con su dieta habitual.  Use un buen sostn de soporte durante el tiempo que le indique su mdico.  Cambie los apsitos vendajes tal como le indic su mdico.  No beba alcohol si toma analgsicos.  Concurra puntualmente a las citas de control con el mdico. Consulte la fecha en que los resultados estarn disponibles. Asegrese de [employer]. SOLICITE ATENCIN MDICA SI:   Presenta enrojecimiento, hinchazn o aumento del dolor en el sitio de la biopsia.  Advierte un olor ftido que proviene del sitio de la biopsia o del vendaje.  El sitio d la biopsia se abre despus de que le han retirado los puntos (suturas), las grapas o la [place].  Tiene una erupcin.  Necesita medicamentos ms fuertes. SOLICITE ATENCIN MDICA DE INMEDIATO SI:   Tiene fiebre.  Aumenta el sangrado (ms de una pequea mancha) en el lugar  de la biopsia.  Tiene dificultad para respirar.  Tiene pus en el sitio de la biopsia. ASEGRESE DE QUE:   Comprende estas instrucciones.  Controlar su enfermedad.  Solicitar ayuda de inmediato si no mejora o si empeora.   Esta informacin no tiene Marine scientist el consejo del mdico. Asegrese de hacerle al mdico cualquier pregunta que tenga.   Document Released: 06/19/2012 Elsevier Interactive Patient Education [employer].

## 2016-03-22 NOTE — Transfer of Care (Signed)
Immediate Anesthesia Transfer of Care Note  Patient: Chelsea Massey  Procedure(s) Performed: Procedure(s) with comments: BREAST BIOPSY WITH NEEDLE LOCALIZATION (Left) - interpreter scheduled - do NOT move  Patient Location: PACU  Anesthesia Type:General  Level of Consciousness: awake and patient cooperative  Airway & Oxygen Therapy: Patient Spontanous Breathing and Patient connected to face mask oxygen  Post-op Assessment: Report given to RN, Post -op Vital signs reviewed and stable and Patient moving all extremities  Post vital signs: Reviewed and stable  Last Vitals:  Filed Vitals:   03/22/16 1000 03/22/16 1005  BP: 143/77 143/78  Temp:    Resp: 22 20    Last Pain: There were no vitals filed for this visit.    Patients Stated Pain Goal: 9 ([id number])  Complications: No apparent anesthesia complications

## 2016-03-22 NOTE — Interval H&P Note (Signed)
History and Physical Interval Note:  03/22/2016 9:40 AM  Chelsea Massey  has presented today for surgery, with the diagnosis of left breast neoplasm unspecified  The various methods of treatment have been discussed with the patient and family. After consideration of risks, benefits and other options for treatment, the patient has consented to  Procedure(s) with comments: BREAST BIOPSY WITH NEEDLE LOCALIZATION (Left) - interpreter scheduled - do NOT move as a surgical intervention .  The patient's history has been reviewed, patient examined, no change in status, stable for surgery.  I have reviewed the patient's chart and labs.  Questions were answered to the patient's satisfaction.     Aviva Signs A

## 2016-03-23 ENCOUNTER — Encounter (HOSPITAL_COMMUNITY): Payer: Self-pay | Admitting: General Surgery

## 2017-03-29 ENCOUNTER — Other Ambulatory Visit (HOSPITAL_COMMUNITY): Payer: Self-pay | Admitting: *Deleted

## 2017-03-29 DIAGNOSIS — Z1231 Encounter for screening mammogram for malignant neoplasm of breast: Secondary | ICD-10-CM

## 2017-04-09 ENCOUNTER — Ambulatory Visit (HOSPITAL_COMMUNITY)
Admission: RE | Admit: 2017-04-09 | Discharge: 2017-04-09 | Disposition: A | Discharge status: Home / Self Care | Payer: PRIVATE HEALTH INSURANCE | Source: Ambulatory Visit | Attending: *Deleted | Admitting: *Deleted

## 2017-04-09 DIAGNOSIS — N6310 Unspecified lump in the right breast, unspecified quadrant: Secondary | ICD-10-CM | POA: Diagnosis not present

## 2017-04-09 DIAGNOSIS — R928 Other abnormal and inconclusive findings on diagnostic imaging of breast: Secondary | ICD-10-CM | POA: Insufficient documentation

## 2017-04-09 DIAGNOSIS — Z1231 Encounter for screening mammogram for malignant neoplasm of breast: Secondary | ICD-10-CM | POA: Diagnosis not present

## 2017-04-12 ENCOUNTER — Other Ambulatory Visit (HOSPITAL_COMMUNITY): Payer: Self-pay | Admitting: *Deleted

## 2017-04-12 DIAGNOSIS — IMO0002 Reserved for concepts with insufficient information to code with codable children: Secondary | ICD-10-CM

## 2017-04-12 DIAGNOSIS — R229 Localized swelling, mass and lump, unspecified: Principal | ICD-10-CM

## 2017-04-24 ENCOUNTER — Other Ambulatory Visit (HOSPITAL_COMMUNITY): Payer: Self-pay | Admitting: *Deleted

## 2017-04-24 ENCOUNTER — Ambulatory Visit (HOSPITAL_COMMUNITY)
Admission: RE | Admit: 2017-04-24 | Discharge: 2017-04-24 | Disposition: A | Discharge status: Home / Self Care | Payer: PRIVATE HEALTH INSURANCE | Source: Ambulatory Visit | Attending: *Deleted | Admitting: *Deleted

## 2017-04-24 DIAGNOSIS — IMO0002 Reserved for concepts with insufficient information to code with codable children: Secondary | ICD-10-CM

## 2017-04-24 DIAGNOSIS — N6311 Unspecified lump in the right breast, upper outer quadrant: Secondary | ICD-10-CM | POA: Insufficient documentation

## 2017-04-24 DIAGNOSIS — R229 Localized swelling, mass and lump, unspecified: Principal | ICD-10-CM

## 2017-04-24 DIAGNOSIS — N6310 Unspecified lump in the right breast, unspecified quadrant: Secondary | ICD-10-CM | POA: Diagnosis present

## 2017-05-01 ENCOUNTER — Other Ambulatory Visit (HOSPITAL_COMMUNITY): Payer: Self-pay | Admitting: *Deleted

## 2017-05-01 ENCOUNTER — Ambulatory Visit (HOSPITAL_COMMUNITY)
Admission: RE | Admit: 2017-05-01 | Discharge: 2017-05-01 | Disposition: A | Discharge status: Home / Self Care | Payer: PRIVATE HEALTH INSURANCE | Source: Ambulatory Visit | Attending: *Deleted | Admitting: *Deleted

## 2017-05-01 ENCOUNTER — Encounter (HOSPITAL_COMMUNITY): Payer: Self-pay

## 2017-05-01 DIAGNOSIS — N6311 Unspecified lump in the right breast, upper outer quadrant: Secondary | ICD-10-CM | POA: Diagnosis not present

## 2017-05-01 DIAGNOSIS — R229 Localized swelling, mass and lump, unspecified: Secondary | ICD-10-CM

## 2017-05-01 DIAGNOSIS — D241 Benign neoplasm of right breast: Secondary | ICD-10-CM | POA: Insufficient documentation

## 2017-05-01 DIAGNOSIS — N6310 Unspecified lump in the right breast, unspecified quadrant: Secondary | ICD-10-CM

## 2017-05-01 DIAGNOSIS — IMO0002 Reserved for concepts with insufficient information to code with codable children: Secondary | ICD-10-CM

## 2017-05-01 MED ORDER — LIDOCAINE HCL (PF) 1 % IJ SOLN
INTRAMUSCULAR | Status: AC
  Administered 2017-05-01: 5 mL
  Filled 2017-05-01: qty 5

## 2017-05-01 MED ORDER — LIDOCAINE-EPINEPHRINE (PF) 1 %-1:200000 IJ SOLN
INTRAMUSCULAR | Status: AC
  Administered 2017-05-01: 5 mL
  Filled 2017-05-01: qty 30

## 2017-11-08 ENCOUNTER — Other Ambulatory Visit (HOSPITAL_COMMUNITY): Payer: Self-pay | Admitting: *Deleted

## 2017-11-08 DIAGNOSIS — R229 Localized swelling, mass and lump, unspecified: Principal | ICD-10-CM

## 2017-11-08 DIAGNOSIS — IMO0002 Reserved for concepts with insufficient information to code with codable children: Secondary | ICD-10-CM

## 2017-11-20 ENCOUNTER — Ambulatory Visit (HOSPITAL_COMMUNITY)
Admission: RE | Admit: 2017-11-20 | Discharge: 2017-11-20 | Disposition: A | Discharge status: Home / Self Care | Payer: PRIVATE HEALTH INSURANCE | Source: Ambulatory Visit | Attending: *Deleted | Admitting: *Deleted

## 2017-11-20 DIAGNOSIS — IMO0002 Reserved for concepts with insufficient information to code with codable children: Secondary | ICD-10-CM

## 2017-11-20 DIAGNOSIS — R229 Localized swelling, mass and lump, unspecified: Secondary | ICD-10-CM

## 2017-11-20 DIAGNOSIS — N6321 Unspecified lump in the left breast, upper outer quadrant: Secondary | ICD-10-CM | POA: Diagnosis present

## 2018-02-03 IMAGING — US US BREAST BX W LOC DEV 1ST LESION IMG BX SPEC US GUIDE*R*
1 series · 9 of 9 positions shown · non-contrast
Comparison: Previous exam(s).

ADDENDUM:
Pathology results of a right breast ultrasound-guided biopsy are
available. Pathology shows fibroadenoma. No malignancy identified.
Pathology results are concordant with imaging findings.

Pathology results were telephoned to the patient today with the
assistance of a Spanish interpreter at the [REDACTED]. The
patient's questions were answered. She states that she is doing well
after the biopsy.
Annual screening mammography is recommended.
CLINICAL DATA: Ultrasound-guided core needle biopsy was recommended
for mass in the 10 o'clock position right breast 8 cm from nipple. A
Spanish interpreter was present throughout the patient's visit
today.
EXAM:
ULTRASOUND GUIDED RIGHT BREAST CORE NEEDLE BIOPSY

[Series 1: us breast bx w loc dev 1st lesion img bx spec us g · 0.07mm/px · 9 of 9 slices shown]
[im 1/9]
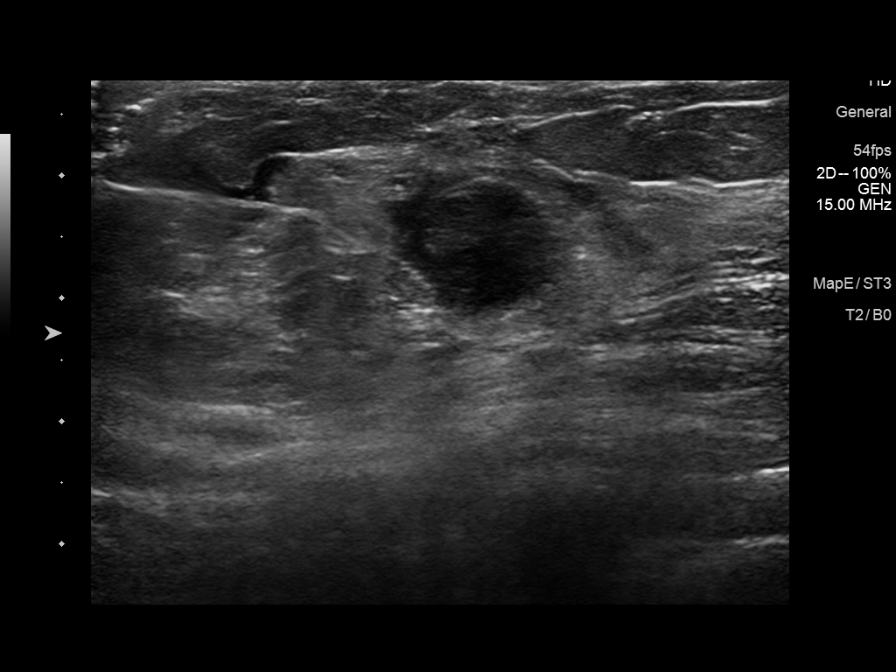
[im 2/9]
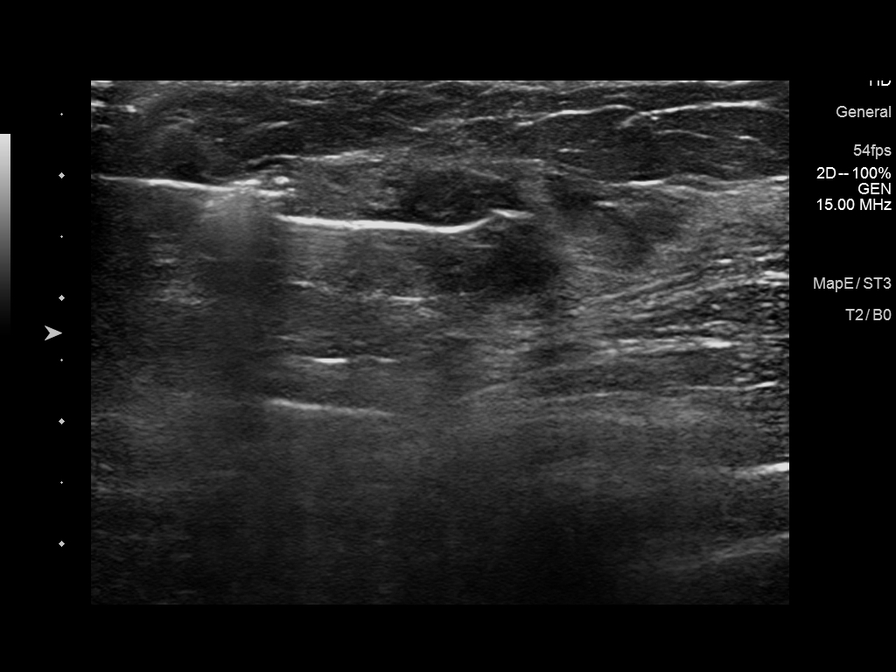
[im 3/9]
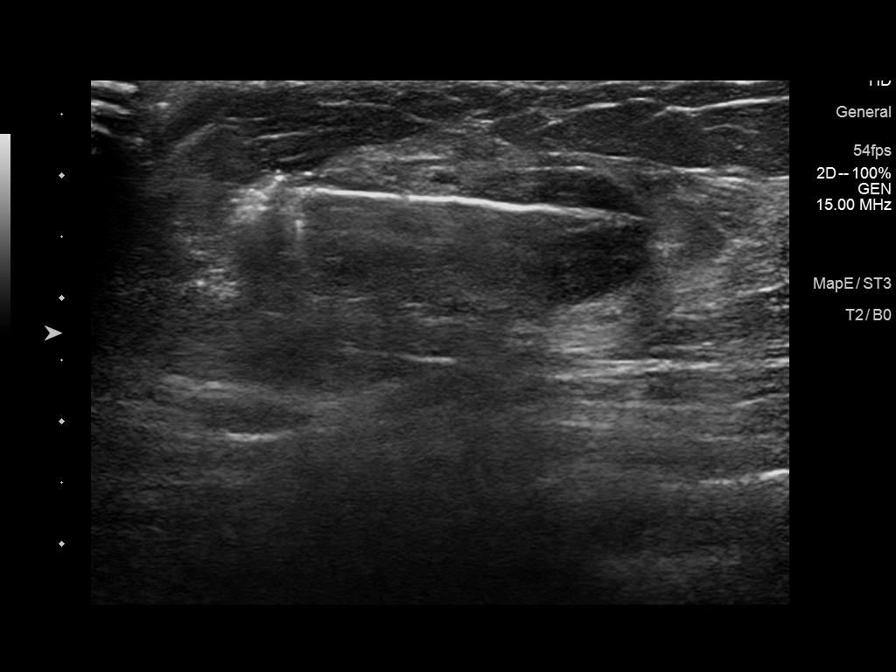
[im 4/9]
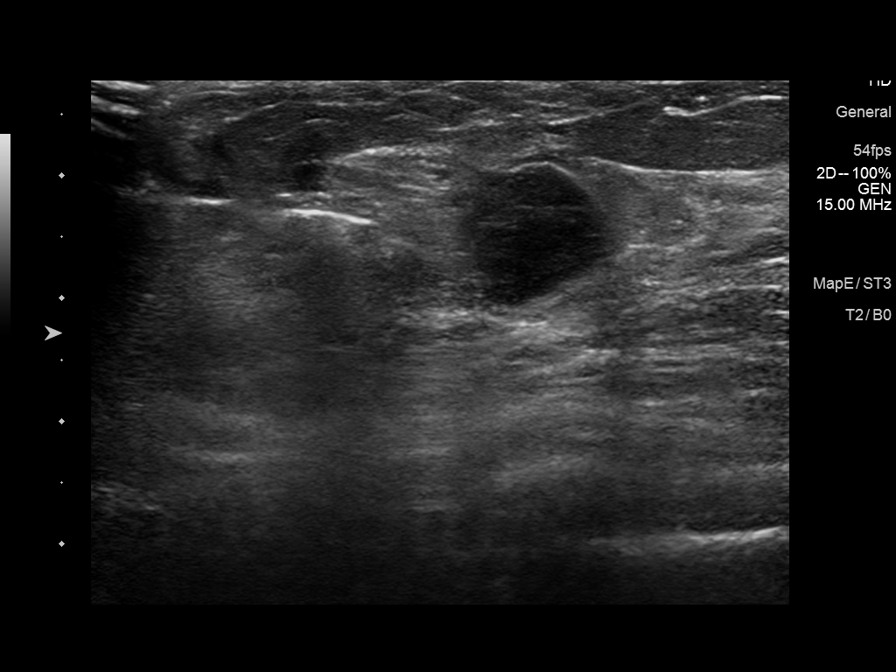
[im 5/9]
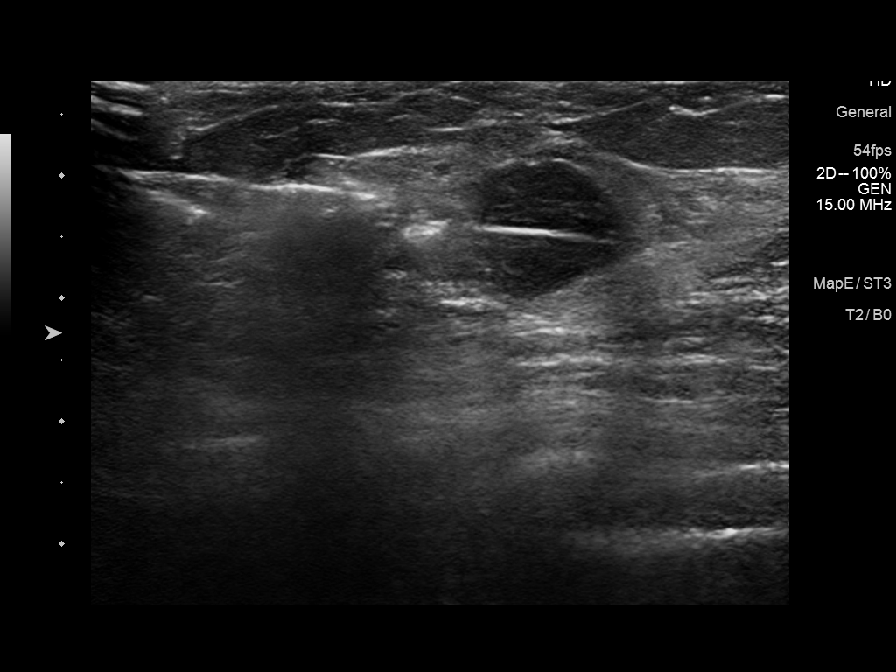
[im 6/9]
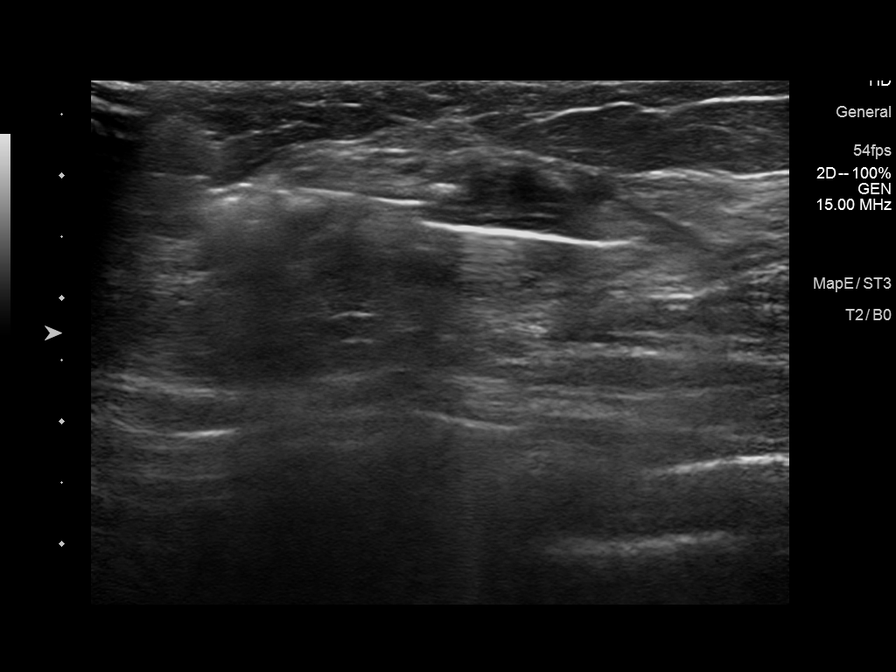
[im 7/9]
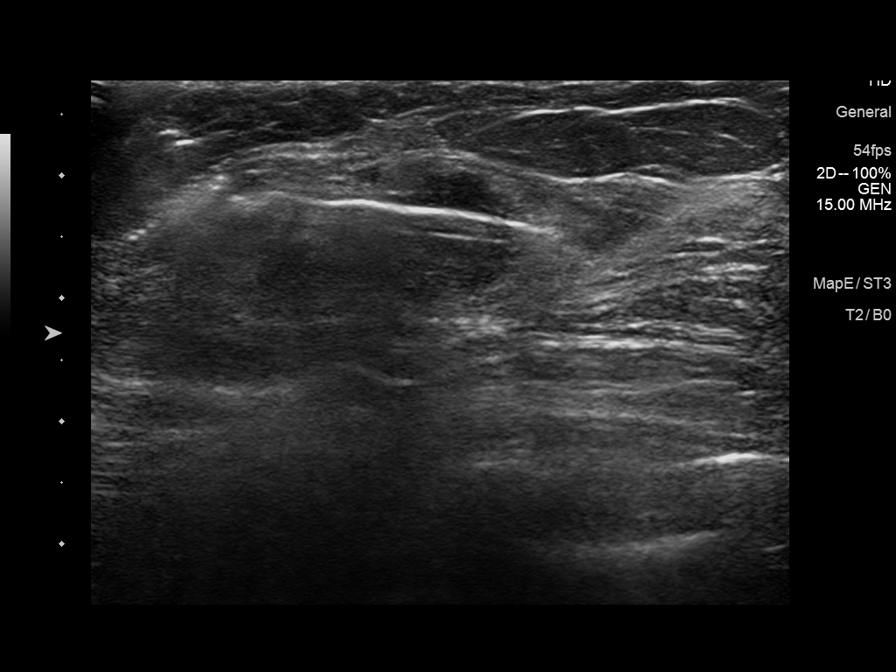
[im 8/9]
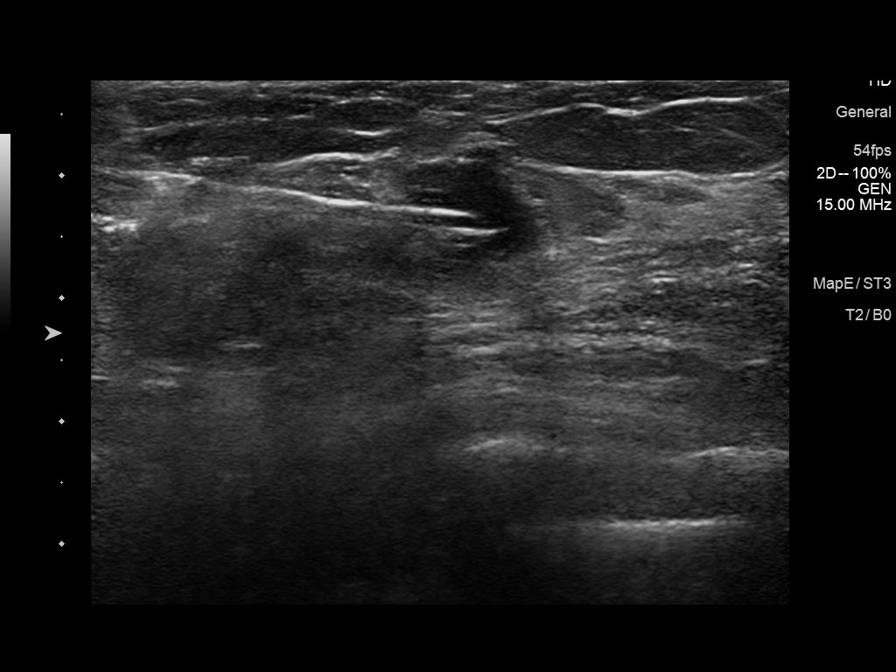
[im 9/9]
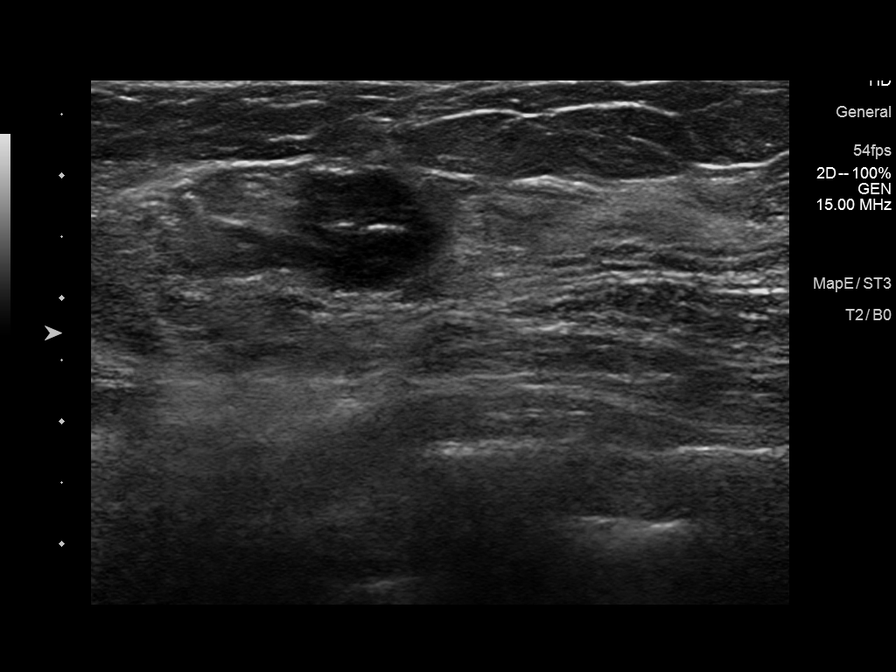

[9 of 9 positions shown; findings below may reference images not displayed]



Lesion quadrant: Upper outer quadrant

Using sterile technique and 1% Lidocaine as local anesthetic, under
direct ultrasound visualization, a 12 gauge Heinrich device was
used to perform biopsy of a mass in the 10 o'clock position the
right breast 8 cm from nipple using a lateral to medial approach. At
the conclusion of the procedure a ribbon shaped tissue marker clip
was deployed into the biopsy cavity. Follow up 2 view mammogram was
performed and dictated separately.
IMPRESSION: Ultrasound guided biopsy of the right breast. No apparent
complications.

## 2018-02-03 IMAGING — MG MM CLIP PLACEMENT
2 series · 2 of 2 positions shown · non-contrast
Comparison: Previous exam(s).

CLINICAL DATA: Ultrasound-guided biopsy was performed of a right
breast mass in the 10 o'clock position.

EXAM:
DIAGNOSTIC RIGHT MAMMOGRAM POST ULTRASOUND BIOPSY

[R ML]
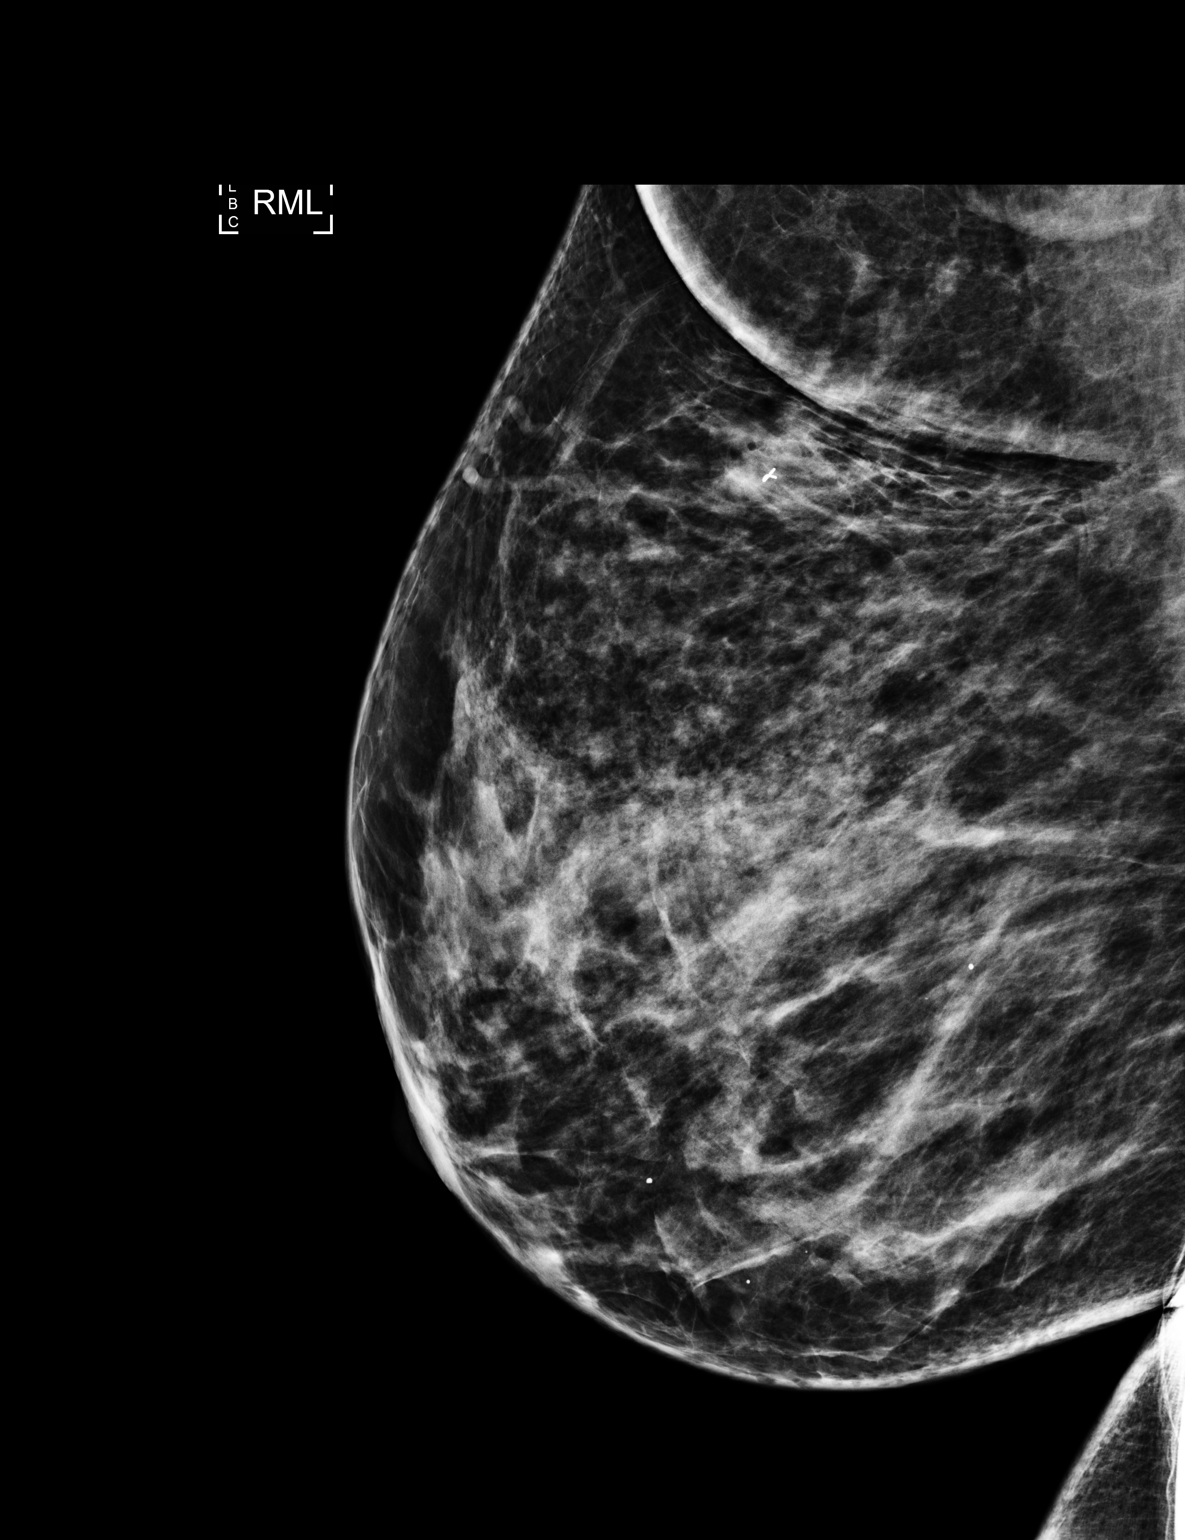

[R CC]
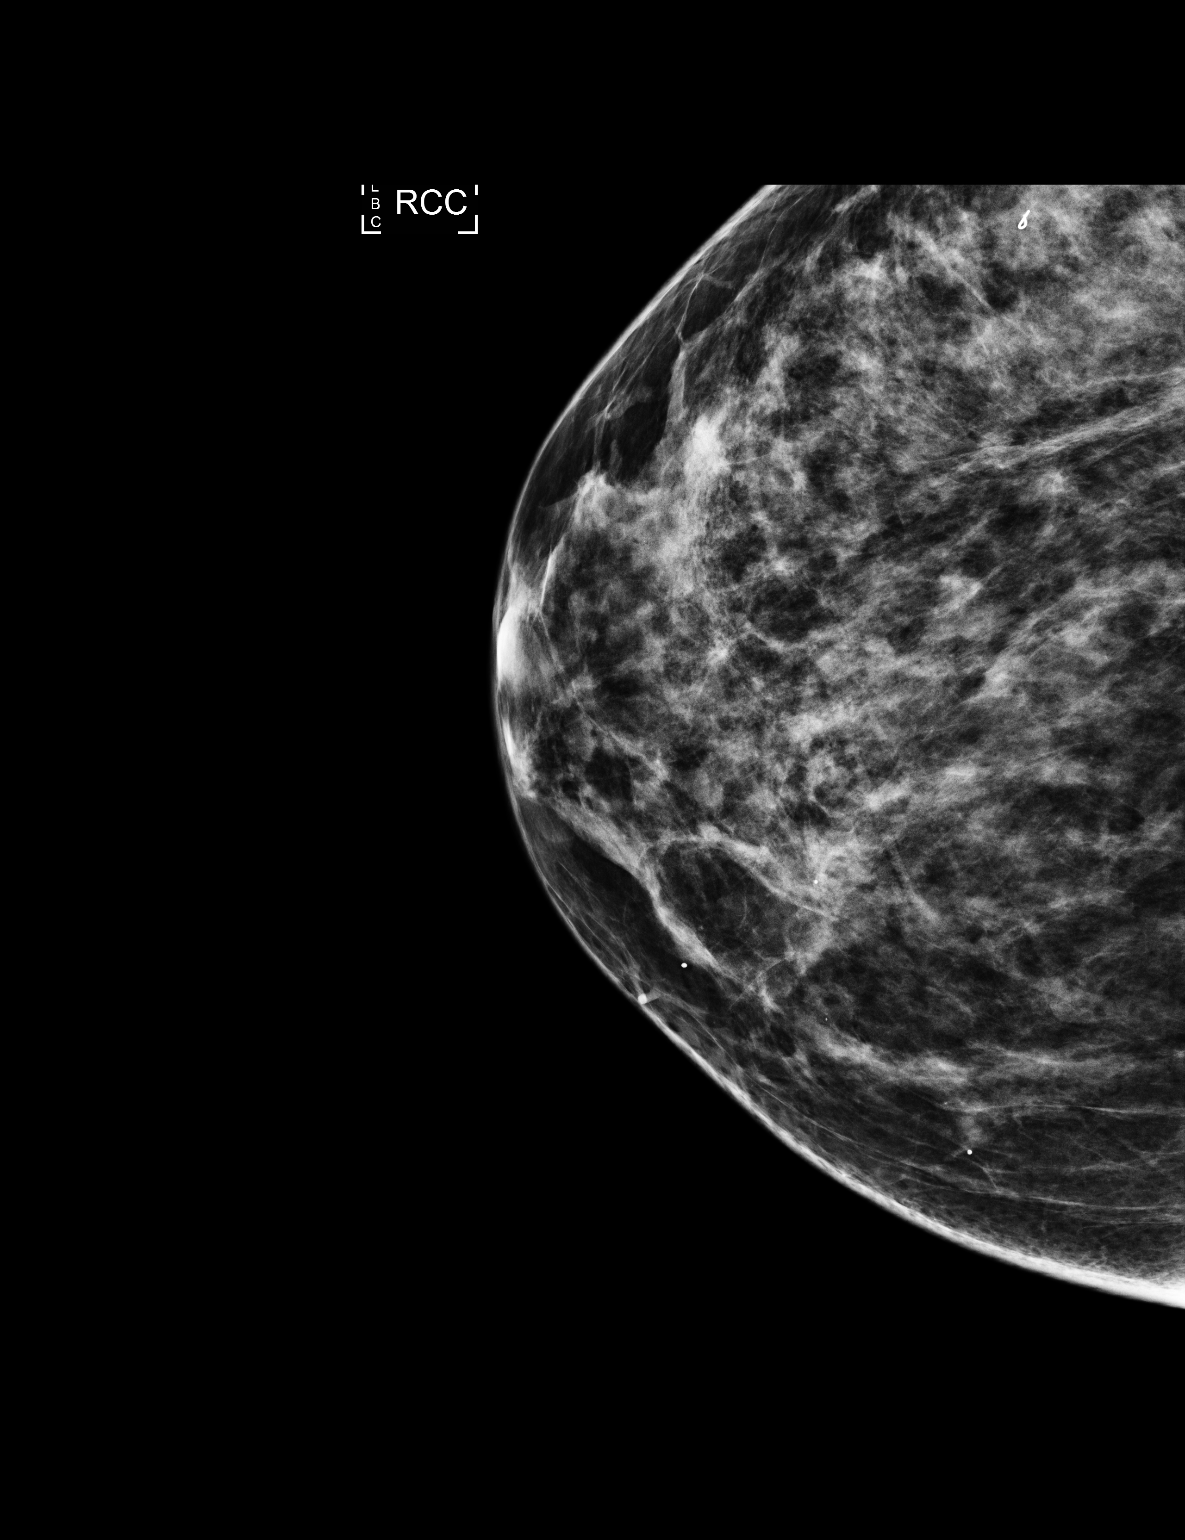

[2 of 2 positions shown; findings below may reference images not displayed]

FINDINGS: Mammographic images were obtained following ultrasound guided biopsy
of a right breast mass in the 10 o'clock position. The ribbon shaped
biopsy clip is satisfactorily positioned within the mass.
IMPRESSION: Satisfactory position of ribbon shaped biopsy clip in the mass.

Final Assessment: Post Procedure Mammograms for Marker Placement

## 2018-05-16 ENCOUNTER — Other Ambulatory Visit (HOSPITAL_COMMUNITY): Payer: Self-pay | Admitting: *Deleted

## 2018-05-16 DIAGNOSIS — Z1231 Encounter for screening mammogram for malignant neoplasm of breast: Secondary | ICD-10-CM

## 2018-05-22 ENCOUNTER — Ambulatory Visit (HOSPITAL_COMMUNITY)
Admission: RE | Admit: 2018-05-22 | Discharge: 2018-05-22 | Disposition: A | Discharge status: Home / Self Care | Payer: PRIVATE HEALTH INSURANCE | Source: Ambulatory Visit | Attending: *Deleted | Admitting: *Deleted

## 2018-05-22 DIAGNOSIS — Z1231 Encounter for screening mammogram for malignant neoplasm of breast: Secondary | ICD-10-CM | POA: Diagnosis not present

## 2019-04-21 ENCOUNTER — Other Ambulatory Visit (HOSPITAL_COMMUNITY): Payer: Self-pay | Admitting: Nurse Practitioner

## 2019-04-21 DIAGNOSIS — Z1231 Encounter for screening mammogram for malignant neoplasm of breast: Secondary | ICD-10-CM

## 2019-05-26 ENCOUNTER — Ambulatory Visit (HOSPITAL_COMMUNITY)
Admission: RE | Admit: 2019-05-26 | Discharge: 2019-05-26 | Disposition: A | Discharge status: Home / Self Care | Payer: Managed Care, Other (non HMO) | Source: Ambulatory Visit | Attending: Nurse Practitioner | Admitting: Nurse Practitioner

## 2019-05-26 ENCOUNTER — Other Ambulatory Visit: Payer: Self-pay

## 2019-05-26 DIAGNOSIS — Z1231 Encounter for screening mammogram for malignant neoplasm of breast: Secondary | ICD-10-CM | POA: Diagnosis present

## 2020-01-12 ENCOUNTER — Ambulatory Visit
Admission: EM | Admit: 2020-01-12 | Discharge: 2020-01-12 | Disposition: A | Discharge status: Home / Self Care | Payer: 59

## 2020-01-12 DIAGNOSIS — M546 Pain in thoracic spine: Secondary | ICD-10-CM | POA: Diagnosis not present

## 2020-01-12 MED ORDER — PREDNISONE 10 MG PO TABS: 20.0000 mg | ORAL_TABLET | Freq: Every day | ORAL | 0 refills | Status: DC

## 2020-01-12 MED ORDER — CYCLOBENZAPRINE HCL 10 MG PO TABS: 10.0000 mg | ORAL_TABLET | Freq: Two times a day (BID) | ORAL | 0 refills | Status: DC | PRN

## 2020-01-12 MED ORDER — IBUPROFEN 800 MG PO TABS: 800.0000 mg | ORAL_TABLET | Freq: Three times a day (TID) | ORAL | 0 refills | Status: DC

## 2020-01-12 NOTE — ED Triage Notes (Signed)
Pt presents with c/o left shoulder pain that began about 4 days ago, denies injury

## 2020-01-12 NOTE — ED Provider Notes (Signed)
[facility]    CSN: [id number] Arrival date & time: 01/12/20  1446      History   Chief Complaint Chief Complaint  Patient presents with  . Shoulder Pain    HPI Chelsea Massey is a 49 y.o. female.   Presented to the urgent care with a complaint of left thoracic back pain for the past 4 days.  Denies any precipitating event.  Localizes pain to her left thoracic back.  Described the pain as constant and achy, rated at 8 on a scale of 1-10.  Has tried OTC ibuprofen without relief.  Her symptoms are made worse with movement.  Denies similar symptoms in the past.  Denies injury, trauma.  The history is provided by the patient. No language interpreter was used.  Shoulder Pain Associated symptoms: back pain and fever     Past Medical History:  Diagnosis Date  . Diabetes mellitus without complication ([place])   . Hypercholesteremia   . Hypertension     There are no problems to display for this patient.   Past Surgical History:  Procedure Laterality Date  . BREAST BIOPSY Left 03/22/2016   Procedure: BREAST BIOPSY WITH NEEDLE LOCALIZATION;  Surgeon: Aviva Signs, MD;  Location: [facility];  Service: General;  Laterality: Left;  interpreter scheduled - do NOT move  . CESAREAN SECTION      OB History   No obstetric history on file.      Home Medications    Prior to Admission medications   Medication Sig Start Date End Date Taking? Authorizing Provider  amLODipine (NORVASC) 5 MG tablet Take 5 mg by mouth every morning. 01/08/20   [provider]  atenolol (TENORMIN) 25 MG tablet Take 25 mg by mouth daily.    [provider]  cyclobenzaprine (FLEXERIL) 10 MG tablet Take 1 tablet (10 mg total) by mouth 2 (two) times daily as needed for muscle spasms. 01/12/20   Darlyne Schmiesing, Darrelyn Hillock, FNP  ibuprofen (ADVIL) 800 MG tablet Take 1 tablet (800 mg total) by mouth 3 (three) times daily. Take with food 01/12/20   Jaysie Benthall, Darrelyn Hillock, FNP   lisinopril-hydrochlorothiazide (PRINZIDE,ZESTORETIC) 20-12.5 MG tablet Take 2 tablets by mouth daily.    [provider]  lovastatin (MEVACOR) 20 MG tablet Take 20 mg by mouth at bedtime.    [provider]  metFORMIN (GLUCOPHAGE) 1000 MG tablet Take 1,000 mg by mouth 2 (two) times daily with a meal.    [provider]  pravastatin (PRAVACHOL) 40 MG tablet Take 40 mg by mouth daily. 12/15/19   [provider]  predniSONE (DELTASONE) 10 MG tablet Take 2 tablets (20 mg total) by mouth daily. 01/12/20   Sheralee Qazi, Darrelyn Hillock, FNP    Family History Family History  Family history unknown: Yes    Social History Social History   Tobacco Use  . Smoking status: Never Smoker  Substance Use Topics  . Alcohol use: No  . Drug use: No     Allergies   Patient has no known allergies.   Review of Systems Review of Systems  Constitutional: Positive for fever.  Respiratory: Negative.   Cardiovascular: Negative.   Musculoskeletal: Positive for back pain.  Neurological: Negative.   All other systems reviewed and are negative.    Physical Exam Triage Vital Signs ED Triage Vitals  Enc Vitals Group     BP 01/12/20 1451 131/83     Pulse Rate 01/12/20 1451 (!) 107     Resp 01/12/20 1451  18     Temp 01/12/20 1451 99.2 F (37.3 C)     Temp src --      SpO2 01/12/20 1451 96 %     Weight --      Height --      Head Circumference --      Peak Flow --      Pain Score 01/12/20 1448 8     Pain Loc --      Pain Edu? --      Excl. in [place]? --    No data found.  Updated Vital Signs BP 131/83   Pulse (!) 107   Temp 99.2 F (37.3 C)   Resp 18   SpO2 96%   Visual Acuity Right Eye Distance:   Left Eye Distance:   Bilateral Distance:    Right Eye Near:   Left Eye Near:    Bilateral Near:     Physical Exam Vitals and nursing note reviewed.  Constitutional:      General: She is not in acute distress.    Appearance: Normal appearance. She is normal  weight. She is not ill-appearing, toxic-appearing or diaphoretic.  Cardiovascular:     Rate and Rhythm: Regular rhythm. Tachycardia present.     Pulses: Normal pulses.     Heart sounds: Normal heart sounds. No murmur. No friction rub. No gallop.   Pulmonary:     Effort: Pulmonary effort is normal. No respiratory distress.     Breath sounds: Normal breath sounds. No stridor. No wheezing, rhonchi or rales.  Chest:     Chest wall: No tenderness.  Musculoskeletal:        General: Tenderness present.     Thoracic back: Spasms and tenderness present.     Comments: Thoracic back pain on movement.  No surface trauma, ecchymosis, warmth, crepitus.  Bony deformity erythema, swelling.  NT on palpation  Neurological:     General: No focal deficit present.     Mental Status: She is alert.     Cranial Nerves: Cranial nerves are intact.     Sensory: Sensation is intact.     Motor: Motor function is intact.     Coordination: Coordination is intact.     Gait: Gait is intact.      UC Treatments / Results  Labs (all labs ordered are listed, but only abnormal results are displayed) Labs Reviewed - No data to display  EKG   Radiology No results found.  Procedures Procedures (including critical care time)  Medications Ordered in UC Medications - No data to display  Initial Impression / Assessment and Plan / UC Course  I have reviewed the triage vital signs and the nursing notes.  Pertinent labs & imaging results that were available during my care of the patient were reviewed by me and considered in my medical decision making (see chart for details).    Patient is stable for discharge. Ibuprofen, prednisone, Flexeril was prescribed. Was advised to follow-up with PCP Return or go to ED for worsening of symptoms  Final Clinical Impressions(s) / UC Diagnoses   Final diagnoses:  Acute left-sided thoracic back pain     Discharge Instructions     Rest, ice and heat as needed Ensure  adequate ROM as tolerated. Prescribed ibuprofen as needed for pain relief Prescribed prednisone for inflammation Prescribed flexeril  for muscle spasm.  Do not drive or operate heavy machinery while taking this medication Return here or go to ER if you have any  new or worsening symptoms such as numbness/tingling of the inner thighs, loss of bladder or bowel control, headache/blurry vision, nausea/vomiting, confusion/altered mental status, dizziness, weakness, passing out, imbalance, etc...      ED Prescriptions    Medication Sig Dispense Auth. Provider   predniSONE (DELTASONE) 10 MG tablet Take 2 tablets (20 mg total) by mouth daily. 15 tablet Tiffiny Worthy S, FNP   ibuprofen (ADVIL) 800 MG tablet Take 1 tablet (800 mg total) by mouth 3 (three) times daily. Take with food 21 tablet Hardy Harcum, Darrelyn Hillock, FNP   cyclobenzaprine (FLEXERIL) 10 MG tablet Take 1 tablet (10 mg total) by mouth 2 (two) times daily as needed for muscle spasms. 20 tablet Makayia Duplessis, Darrelyn Hillock, FNP     PDMP not reviewed this encounter.   Emerson Monte, FNP 01/12/20 1513

## 2020-01-12 NOTE — Discharge Instructions (Addendum)
Rest, ice and heat as needed Ensure adequate ROM as tolerated. Prescribed ibuprofen as needed for pain relief Prescribed prednisone for inflammation Prescribed flexeril  for muscle spasm.  Do not drive or operate heavy machinery while taking this medication Return here or go to ER if you have any new or worsening symptoms such as numbness/tingling of the inner thighs, loss of bladder or bowel control, headache/blurry vision, nausea/vomiting, confusion/altered mental status, dizziness, weakness, passing out, imbalance, etc..Marland Kitchen

## 2020-01-31 ENCOUNTER — Ambulatory Visit: Payer: 59 | Attending: Internal Medicine

## 2020-01-31 DIAGNOSIS — Z23 Encounter for immunization: Secondary | ICD-10-CM

## 2020-01-31 NOTE — Progress Notes (Signed)
   Covid-19 Vaccination Clinic  Name:  Chelsea Massey    MRN: [id number] DOB: [birth year]  01/31/2020  [name] was observed post Covid-19 immunization for 15 minutes without incident. She was provided with Vaccine Information Sheet and instruction to access the V-Safe system.   [name] was instructed to call 911 with any severe reactions post vaccine: Marland Kitchen Difficulty breathing  . Swelling of face and throat  . A fast heartbeat  . A bad rash all over body  . Dizziness and weakness   Immunizations Administered    Name Date Dose VIS Date Route   Pfizer COVID-19 Vaccine 01/31/2020  4:26 PM 0.3 mL 12/03/2018 Intramuscular   Manufacturer: [place]   Lot: [id number]   [place]: [id number]

## 2020-02-23 ENCOUNTER — Ambulatory Visit: Payer: 59 | Attending: Internal Medicine

## 2020-02-23 DIAGNOSIS — Z23 Encounter for immunization: Secondary | ICD-10-CM

## 2020-02-23 NOTE — Progress Notes (Signed)
   Covid-19 Vaccination Clinic  Name:  Mintie Shanker    MRN: [id number] DOB: 18-Jul-1971  02/23/2020  Ms. Nissan was observed post Covid-19 immunization for 15 minutes without incident. She was provided with Vaccine Information Sheet and instruction to access the V-Safe system.   Ms. Malinowski was instructed to call 911 with any severe reactions post vaccine: Marland Kitchen Difficulty breathing  . Swelling of face and throat  . A fast heartbeat  . A bad rash all over body  . Dizziness and weakness   Immunizations Administered    Name Date Dose VIS Date Route   Pfizer COVID-19 Vaccine 02/23/2020  4:15 PM 0.3 mL 12/03/2018 Intramuscular   Manufacturer: [place]   Lot: [id number]   [place]: [id number]

## 2020-08-24 ENCOUNTER — Other Ambulatory Visit: Payer: 59 | Admitting: Obstetrics & Gynecology

## 2020-10-18 ENCOUNTER — Other Ambulatory Visit: Payer: No Typology Code available for payment source | Admitting: Obstetrics & Gynecology

## 2020-11-08 ENCOUNTER — Other Ambulatory Visit: Payer: No Typology Code available for payment source | Admitting: Obstetrics & Gynecology

## 2020-11-16 ENCOUNTER — Other Ambulatory Visit: Payer: No Typology Code available for payment source | Admitting: Obstetrics & Gynecology

## 2020-12-16 ENCOUNTER — Encounter: Payer: Self-pay | Admitting: Obstetrics & Gynecology

## 2020-12-16 ENCOUNTER — Other Ambulatory Visit (HOSPITAL_COMMUNITY)
Admission: RE | Admit: 2020-12-16 | Discharge: 2020-12-16 | Disposition: A | Discharge status: Home / Self Care | Payer: No Typology Code available for payment source | Source: Ambulatory Visit | Attending: Obstetrics & Gynecology | Admitting: Obstetrics & Gynecology

## 2020-12-16 ENCOUNTER — Other Ambulatory Visit: Payer: Self-pay

## 2020-12-16 ENCOUNTER — Ambulatory Visit (INDEPENDENT_AMBULATORY_CARE_PROVIDER_SITE_OTHER): Payer: No Typology Code available for payment source | Admitting: Obstetrics & Gynecology

## 2020-12-16 VITALS — BP 153/91 | HR 97 | Wt 161.0 lb

## 2020-12-16 DIAGNOSIS — Z01419 Encounter for gynecological examination (general) (routine) without abnormal findings: Secondary | ICD-10-CM | POA: Diagnosis not present

## 2020-12-16 DIAGNOSIS — Z3202 Encounter for pregnancy test, result negative: Secondary | ICD-10-CM

## 2020-12-16 DIAGNOSIS — Z3046 Encounter for surveillance of implantable subdermal contraceptive: Secondary | ICD-10-CM

## 2020-12-16 DIAGNOSIS — Z308 Encounter for other contraceptive management: Secondary | ICD-10-CM | POA: Diagnosis not present

## 2020-12-16 LAB — POCT URINE PREGNANCY: Preg Test, Ur: NEGATIVE

## 2020-12-16 MED ORDER — ETONOGESTREL 68 MG ~~LOC~~ IMPL
68.0000 mg | DRUG_IMPLANT | Freq: Once | SUBCUTANEOUS | Status: AC
  Administered 2020-12-16: 68 mg via SUBCUTANEOUS

## 2020-12-16 NOTE — Progress Notes (Signed)
Subjective:     Chelsea Massey is a 50 y.o. female here for a routine exam.  No LMP recorded. Patient has had an implant. [id number] Birth Control Method:  Nexplanon, to be removed/replaced today Menstrual Calendar(currently): amenorrheic on Nexplanon  Current complaints: none.   Current acute medical issues:  none   Recent Gynecologic History No LMP recorded. Patient has had an implant. Last Pap: 2020,  normal Last mammogram: 2021,  normal  Past Medical History:  Diagnosis Date  . Diabetes mellitus without complication ([place])   . Hypercholesteremia   . Hypertension     Past Surgical History:  Procedure Laterality Date  . BREAST BIOPSY Left 03/22/2016   Procedure: BREAST BIOPSY WITH NEEDLE LOCALIZATION;  Surgeon: Aviva Signs, MD;  Location: [facility];  Service: General;  Laterality: Left;  interpreter scheduled - do NOT move  . CESAREAN SECTION      OB History    Gravida  4   Para  2   Term  2   Preterm  0   AB  2   Living  3     SAB  2   IAB      Ectopic      Multiple  1   Live Births  3           Social History   Socioeconomic History  . Marital status: Married    Spouse name: Not on file  . Number of children: 3  . Years of education: Not on file  . Highest education level: Not on file  Occupational History  . Not on file  Tobacco Use  . Smoking status: Never Smoker  . Smokeless tobacco: Never Used  Vaping Use  . Vaping Use: Never used  Substance and Sexual Activity  . Alcohol use: No  . Drug use: No  . Sexual activity: Yes    Birth control/protection: Implant, Condom  Other Topics Concern  . Not on file  Social History Narrative  . Not on file   Social Determinants of Health   Financial Resource Strain: Low Risk   . Difficulty of Paying Living Expenses: Not very hard  Food Insecurity: No Food Insecurity  . Worried About Charity fundraiser in the Last Year: Never true  . Ran Out of Food in the Last Year: Never true   Transportation Needs: No Transportation Needs  . Lack of Transportation (Medical): No  . Lack of Transportation (Non-Medical): No  Physical Activity: Insufficiently Active  . Days of Exercise per Week: 3 days  . Minutes of Exercise per Session: 30 min  Stress: No Stress Concern Present  . Feeling of Stress : Not at all  Social Connections: Moderately Isolated  . Frequency of Communication with Friends and Family: Once a week  . Frequency of Social Gatherings with Friends and Family: Once a week  . Attends Religious Services: 1 to 4 times per year  . Active Member of Clubs or Organizations: No  . Attends Archivist Meetings: Never  . Marital Status: Married    Family History  Family history unknown: Yes     Current Outpatient Medications:  .  amLODipine (NORVASC) 5 MG tablet, Take 5 mg by mouth every morning., Disp: , Rfl:  .  dapagliflozin propanediol (FARXIGA) 10 MG TABS tablet, Take 10 mg by mouth daily., Disp: , Rfl:  .  lisinopril-hydrochlorothiazide (PRINZIDE,ZESTORETIC) 20-12.5 MG tablet, Take 2 tablets by mouth daily., Disp: , Rfl:  .  metFORMIN (GLUCOPHAGE) 500  MG tablet, Take 500 mg by mouth 2 (two) times daily., Disp: , Rfl:   Current Facility-Administered Medications:  .  etonogestrel (NEXPLANON) implant 68 mg, 68 mg, Subdermal, Once, Florian Buff, MD  Review of Systems  Review of Systems  Constitutional: Negative for fever, chills, weight loss, malaise/fatigue and diaphoresis.  HENT: Negative for hearing loss, ear pain, nosebleeds, congestion, sore throat, neck pain, tinnitus and ear discharge.   Eyes: Negative for blurred vision, double vision, photophobia, pain, discharge and redness.  Respiratory: Negative for cough, hemoptysis, sputum production, shortness of breath, wheezing and stridor.   Cardiovascular: Negative for chest pain, palpitations, orthopnea, claudication, leg swelling and PND.  Gastrointestinal: negative for abdominal pain. Negative  for heartburn, nausea, vomiting, diarrhea, constipation, blood in stool and melena.  Genitourinary: Negative for dysuria, urgency, frequency, hematuria and flank pain.  Musculoskeletal: Negative for myalgias, back pain, joint pain and falls.  Skin: Negative for itching and rash.  Neurological: Negative for dizziness, tingling, tremors, sensory change, speech change, focal weakness, seizures, loss of consciousness, weakness and headaches.  Endo/Heme/Allergies: Negative for environmental allergies and polydipsia. Does not bruise/bleed easily.  Psychiatric/Behavioral: Negative for depression, suicidal ideas, hallucinations, memory loss and substance abuse. The patient is not nervous/anxious and does not have insomnia.        Objective:  Blood pressure (!) 153/91, pulse 97, weight 161 lb (73 kg).   Physical Exam  Vitals reviewed. Constitutional: She is oriented to person, place, and time. She appears well-developed and well-nourished.  HENT:  Head: Normocephalic and atraumatic.        Right Ear: External ear normal.  Left Ear: External ear normal.  Nose: Nose normal.  Mouth/Throat: Oropharynx is clear and moist.  Eyes: Conjunctivae and EOM are normal. Pupils are equal, round, and reactive to light. Right eye exhibits no discharge. Left eye exhibits no discharge. No scleral icterus.  Neck: Normal range of motion. Neck supple. No tracheal deviation present. No thyromegaly present.  Cardiovascular: Normal rate, regular rhythm, normal heart sounds and intact distal pulses.  Exam reveals no gallop and no friction rub.   No murmur heard. Respiratory: Effort normal and breath sounds normal. No respiratory distress. She has no wheezes. She has no rales. She exhibits no tenderness.  GI: Soft. Bowel sounds are normal. She exhibits no distension and no mass. There is no tenderness. There is no rebound and no guarding.  Genitourinary:  Breasts no masses skin changes or nipple changes bilaterally       Vulva is normal without lesions Vagina is pink moist without discharge Cervix normal in appearance and pap is done Uterus is normal size shape and contour Adnexa is negative with normal sized ovaries   Musculoskeletal: Normal range of motion. She exhibits no edema and no tenderness.  Neurological: She is alert and oriented to person, place, and time. She has normal reflexes. She displays normal reflexes. No cranial nerve deficit. She exhibits normal muscle tone. Coordination normal.  Skin: Skin is warm and dry. No rash noted. No erythema. No pallor.  Psychiatric: She has a normal mood and affect. Her behavior is normal. Judgment and thought content normal.       Medications Ordered at today's visit: Meds ordered this encounter  Medications  . etonogestrel (NEXPLANON) implant 68 mg    Other orders placed at today's visit: Orders Placed This Encounter  Procedures  . POCT urine pregnancy      Assessment:    Normal Gyn exam.    Plan:  Contraception: Nexplanon. Mammogram ordered. Follow up in: 3 years.     Return in about 3 years (around 12/17/2023) for yearly, nexplanon removal.   Procedure note: removal The left arm is prepped in usual sterile fashion Received 3 cc of 1% lidocaine is injected at the site of the Nexplanon insertion The Nexplanon is palpated without difficulty A small stab incision is made with an 11 blade Curved hemostats were used and the Nexplanon rod was removed without difficulty   reinsertion The new sterile Nexplanon device is removed from the packaging The Nexplanon is inserted without difficulty It is palpated by both me and the patient Hemostasis is achieved Peri-Strips were placed in elastic bandage placed which remain in place for 2 days  Lot  [id number] Exp may 12,2024  To be removed 12/17/2023 or as requested  Florian Buff, MD 12/16/2020 10:08 AM

## 2020-12-20 LAB — CYTOLOGY - PAP
Comment: NEGATIVE
Diagnosis: NEGATIVE
High risk HPV: NEGATIVE

## 2020-12-31 ENCOUNTER — Other Ambulatory Visit (HOSPITAL_COMMUNITY): Payer: Self-pay | Admitting: Obstetrics & Gynecology

## 2020-12-31 ENCOUNTER — Other Ambulatory Visit (HOSPITAL_COMMUNITY): Payer: Self-pay | Admitting: Family Medicine

## 2020-12-31 DIAGNOSIS — Z1231 Encounter for screening mammogram for malignant neoplasm of breast: Secondary | ICD-10-CM

## 2021-01-03 ENCOUNTER — Ambulatory Visit (HOSPITAL_COMMUNITY)
Admission: RE | Admit: 2021-01-03 | Discharge: 2021-01-03 | Disposition: A | Discharge status: Home / Self Care | Payer: No Typology Code available for payment source | Source: Ambulatory Visit | Attending: Obstetrics & Gynecology | Admitting: Obstetrics & Gynecology

## 2021-01-03 ENCOUNTER — Other Ambulatory Visit: Payer: Self-pay

## 2021-01-03 DIAGNOSIS — Z1231 Encounter for screening mammogram for malignant neoplasm of breast: Secondary | ICD-10-CM | POA: Insufficient documentation

## 2021-01-05 ENCOUNTER — Other Ambulatory Visit: Payer: Self-pay

## 2021-01-05 ENCOUNTER — Ambulatory Visit
Admission: EM | Admit: 2021-01-05 | Discharge: 2021-01-05 | Disposition: A | Discharge status: Home / Self Care | Payer: No Typology Code available for payment source | Attending: Family Medicine | Admitting: Family Medicine

## 2021-01-05 ENCOUNTER — Encounter: Payer: Self-pay | Admitting: Emergency Medicine

## 2021-01-05 DIAGNOSIS — H66002 Acute suppurative otitis media without spontaneous rupture of ear drum, left ear: Secondary | ICD-10-CM | POA: Diagnosis not present

## 2021-01-05 MED ORDER — AMOXICILLIN 875 MG PO TABS: 875.0000 mg | ORAL_TABLET | Freq: Two times a day (BID) | ORAL | 0 refills | Status: AC

## 2021-01-05 NOTE — ED Provider Notes (Signed)
[place]   [id number] 01/05/21 Arrival Time: 49  ASSESSMENT & PLAN:  1. Non-recurrent acute suppurative otitis media of left ear without spontaneous rupture of tympanic membrane    Begin: Meds ordered this encounter  Medications  . amoxicillin (AMOXIL) 875 MG tablet    Sig: Take 1 tablet (875 mg total) by mouth 2 (two) times daily for 10 days.    Dispense:  14 tablet    Refill:  0    Discussed typical duration of symptoms. OTC symptom care as needed. Ensure adequate fluid intake and rest. May f/u with PCP or here as needed.  Reviewed expectations re: course of current medical issues. Questions answered. Outlined signs and symptoms indicating need for more acute intervention. Patient verbalized understanding. After Visit Summary given.   SUBJECTIVE: History from: patient.  Chelsea Massey is a 50 y.o. female who presents with complaint of left otalgia; without drainage; without bleeding. Onset abrupt, yesterday. Recent cold symptoms: thinks so. Fever: no. Overall normal PO intake without n/v. Sick contacts: no. OTC treatment: none reported.  Social History   Tobacco Use  Smoking Status Never Smoker  Smokeless Tobacco Never Used      OBJECTIVE:  Vitals:   01/05/21 1557  BP: 136/83  Pulse: (!) 124  Resp: 18  Temp: 99.3 F (37.4 C)  TempSrc: Oral  SpO2: 96%    Recheck HR: 114 and reg; appears anxious General appearance: alert Ear Canal: normal TM: right: erythematous, bulging Neck: supple without LAD Lungs: unlabored respirations, symmetrical air entry; cough: absent; no respiratory distress Skin: warm and dry Psychological: alert and cooperative; normal mood and affect  No Known Allergies  Past Medical History:  Diagnosis Date  . Diabetes mellitus without complication ([place])   . Hypercholesteremia   . Hypertension    Family History  Family history unknown: Yes   Social History   Socioeconomic History  . Marital status:  Married    Spouse name: Not on file  . Number of children: 3  . Years of education: Not on file  . Highest education level: Not on file  Occupational History  . Not on file  Tobacco Use  . Smoking status: Never Smoker  . Smokeless tobacco: Never Used  Vaping Use  . Vaping Use: Never used  Substance and Sexual Activity  . Alcohol use: No  . Drug use: No  . Sexual activity: Yes    Birth control/protection: Implant, Condom  Other Topics Concern  . Not on file  Social History Narrative  . Not on file   Social Determinants of Health   Financial Resource Strain: Low Risk   . Difficulty of Paying Living Expenses: Not very hard  Food Insecurity: No Food Insecurity  . Worried About Charity fundraiser in the Last Year: Never true  . Ran Out of Food in the Last Year: Never true  Transportation Needs: No Transportation Needs  . Lack of Transportation (Medical): No  . Lack of Transportation (Non-Medical): No  Physical Activity: Insufficiently Active  . Days of Exercise per Week: 3 days  . Minutes of Exercise per Session: 30 min  Stress: No Stress Concern Present  . Feeling of Stress : Not at all  Social Connections: Moderately Isolated  . Frequency of Communication with Friends and Family: Once a week  . Frequency of Social Gatherings with Friends and Family: Once a week  . Attends Religious Services: 1 to 4 times per year  . Active Member of Clubs or Organizations:  No  . Attends Club or Organization Meetings: Never  . Marital Status: Married  Human resources officer Violence: Not At Risk  . Fear of Current or Ex-Partner: No  . Emotionally Abused: No  . Physically Abused: No  . Sexually Abused: No            Vanessa Kick, MD 01/06/21 [phone]

## 2021-01-05 NOTE — Discharge Instructions (Addendum)
If not allergic, you may use over the counter ibuprofen or acetaminophen as needed. ° °

## 2021-01-05 NOTE — ED Triage Notes (Signed)
Left ear ache since yesterday.

## 2021-11-02 ENCOUNTER — Other Ambulatory Visit (HOSPITAL_COMMUNITY): Payer: Self-pay | Admitting: Family Medicine

## 2021-11-02 DIAGNOSIS — Z1231 Encounter for screening mammogram for malignant neoplasm of breast: Secondary | ICD-10-CM

## 2022-01-06 ENCOUNTER — Ambulatory Visit (HOSPITAL_COMMUNITY)
Admission: RE | Admit: 2022-01-06 | Discharge: 2022-01-06 | Disposition: A | Discharge status: Home / Self Care | Payer: BC Managed Care – PPO | Source: Ambulatory Visit | Attending: Family Medicine | Admitting: Family Medicine

## 2022-01-06 DIAGNOSIS — Z1231 Encounter for screening mammogram for malignant neoplasm of breast: Secondary | ICD-10-CM | POA: Diagnosis present

## 2022-02-07 ENCOUNTER — Other Ambulatory Visit: Payer: Self-pay | Admitting: Family Medicine

## 2022-02-07 ENCOUNTER — Other Ambulatory Visit (HOSPITAL_COMMUNITY): Payer: Self-pay | Admitting: Family Medicine

## 2022-02-07 DIAGNOSIS — M25512 Pain in left shoulder: Secondary | ICD-10-CM

## 2022-02-24 ENCOUNTER — Encounter (HOSPITAL_COMMUNITY): Payer: Self-pay

## 2022-02-24 ENCOUNTER — Ambulatory Visit (HOSPITAL_COMMUNITY): Payer: BC Managed Care – PPO

## 2022-04-24 ENCOUNTER — Ambulatory Visit (INDEPENDENT_AMBULATORY_CARE_PROVIDER_SITE_OTHER): Payer: BC Managed Care – PPO | Admitting: Cardiology

## 2022-04-24 ENCOUNTER — Encounter: Payer: Self-pay | Admitting: Cardiology

## 2022-04-24 VITALS — BP 110/62 | HR 95 | Ht 64.0 in | Wt 147.0 lb

## 2022-04-24 DIAGNOSIS — R011 Cardiac murmur, unspecified: Secondary | ICD-10-CM | POA: Diagnosis not present

## 2022-04-24 DIAGNOSIS — I10 Essential (primary) hypertension: Secondary | ICD-10-CM

## 2022-04-24 NOTE — Patient Instructions (Addendum)

## 2022-04-24 NOTE — Progress Notes (Signed)
Cardiology Office Note  Date: 04/24/2022   ID: Yliana Gravois, DOB 1970-12-11, MRN [id number]  PCP:  Olga Coaster, FNP  Cardiologist:  Rozann Lesches, MD Electrophysiologist:  None   Chief Complaint  Patient presents with   Heart Murmur    History of Present Illness: Chelsea Massey is a 51 y.o. female referred for cardiology consultation by Ms. Bucio NP with the [facility] for the evaluation of heart murmur.  She is here today with her daughter and a Spanish interpreter.  She does not report any history of valvular heart disease, no obvious rheumatic fever, no family history of valvular heart disease or cardiomyopathy per discussion.  She does not describe any exertional symptoms or major functional limitations, no palpitations or unexplained syncope.  She is on medical therapy for treatment of hypertension as outlined below.  Today's blood pressure is very well controlled.  I personally reviewed her ECG which shows sinus tachycardia with rightward axis, nonspecific ST-T wave changes.  She has not undergone a baseline echocardiogram.  Past Medical History:  Diagnosis Date   Essential hypertension    Mixed hyperlipidemia    Type 2 diabetes mellitus ([place])     Past Surgical History:  Procedure Laterality Date   BREAST BIOPSY Left 03/22/2016   Procedure: BREAST BIOPSY WITH NEEDLE LOCALIZATION;  Surgeon: Aviva Signs, MD;  Location: [facility];  Service: General;  Laterality: Left;  interpreter scheduled - do NOT move   CESAREAN SECTION      Current Outpatient Medications  Medication Sig Dispense Refill   amLODipine (NORVASC) 10 MG tablet TAKE 1 TABLET BY MOUTH EVERY DAY FOR 90 DAYS     atorvastatin (LIPITOR) 10 MG tablet Take 10 mg by mouth daily.     lisinopril-hydrochlorothiazide (PRINZIDE,ZESTORETIC) 20-12.5 MG tablet Take 1 tablet by mouth daily.     metFORMIN (GLUCOPHAGE) 500 MG tablet Take 500 mg by mouth 2 (two) times daily.     amLODipine  (NORVASC) 5 MG tablet Take 5 mg by mouth every morning. (Patient not taking: Reported on 04/24/2022)     dapagliflozin propanediol (FARXIGA) 10 MG TABS tablet Take 10 mg by mouth daily. (Patient not taking: Reported on 04/24/2022)     No current facility-administered medications for this visit.   Allergies:  Patient has no known allergies.   Social History: The patient  reports that she has never smoked. She has never used smokeless tobacco. She reports that she does not drink alcohol and does not use drugs.   Family History: The patient's family history includes Diabetes in her brother; Hypertension in her father, mother, and sister.   ROS: No orthopnea or PND.  No leg swelling.  Physical Exam: VS:  BP 110/62 (BP Location: Right Arm, Patient Position: Sitting, Cuff Size: Large)   Pulse 95   Ht '5\' 4"'$  (1.626 m)   Wt 147 lb (66.7 kg)   SpO2 99%   BMI 25.23 kg/m , BMI Body mass index is 25.23 kg/m.  Wt Readings from Last 3 Encounters:  04/24/22 147 lb (66.7 kg)  12/16/20 161 lb (73 kg)  03/16/16 159 lb (72.1 kg)    General: Patient appears comfortable at rest. HEENT: Conjunctiva and lids normal, oropharynx clear. Neck: Supple, no elevated JVP or carotid bruits, no thyromegaly. Lungs: Clear to auscultation, nonlabored breathing at rest. Cardiac: Regular rate and rhythm, no S3, 2/6 systolic murmur. Abdomen: Soft, nontender, bowel sounds present. Extremities: No pitting edema, distal pulses 2+. Skin: Warm and dry.  Musculoskeletal: No kyphosis. Neuropsychiatric: Alert and oriented x3, affect grossly appropriate.  ECG:  An ECG dated 03/16/2016 was personally reviewed today and demonstrated:  Sinus rhythm.  Recent Labwork:  February 2023: Hemoglobin 14.4, platelets 288, potassium 3.4, BUN 24, creatinine 0.97, AST 10, ALT 23 Feb 2022: Hemoglobin A1c 5.7%  Other Studies Reviewed Today:  No prior cardiac testing for review today.  Assessment and Plan:  1.  Systolic murmur, 2/6  with preserved second heart sound.  She is asymptomatic.  She does have hypertension and type 2 diabetes mellitus at baseline, family history of hypertension but no obvious cardiomyopathy or valvular heart disease described.  Most likely benign, but we will obtain an echocardiogram to ensure structurally normal heart, exclude hypertrophic cardiomyopathy and bicuspid aortic valve mainly.  2.  Essential hypertension, blood pressure is well controlled today on current regimen including Norvasc and Prinzide.  3.  Type 2 diabetes mellitus with recent hemoglobin A1c 5.7%.  She is on Glucophage and [place] with follow-up by PCP.  4.  Mixed hyperlipidemia in the setting of type 2 diabetes mellitus, currently on Lipitor.  I do not have recent lipid profile for review.  Medication Adjustments/Labs and Tests Ordered: Current medicines are reviewed at length with the patient today.  Concerns regarding medicines are outlined above.   Tests Ordered: Orders Placed This Encounter  Procedures   EKG 12-Lead   ECHOCARDIOGRAM COMPLETE    Medication Changes: No orders of the defined types were placed in this encounter.   Disposition:  Follow up  test results.  Signed, Satira Sark, MD, [facility] 04/24/2022 11:12 AM    [facility] [place], [place], [place] [address] Phone: [phone]; Fax: [phone]

## 2022-04-26 ENCOUNTER — Other Ambulatory Visit: Payer: BC Managed Care – PPO

## 2022-05-09 ENCOUNTER — Encounter: Payer: Self-pay | Admitting: *Deleted

## 2022-06-27 ENCOUNTER — Telehealth: Payer: Self-pay | Admitting: *Deleted

## 2022-06-27 NOTE — Telephone Encounter (Signed)
Contacted to see if she planned to do echocardiogram. Patient inquired about cost and number given to contact billing department [phone] opt [phone]).

## 2022-08-04 ENCOUNTER — Telehealth: Payer: Self-pay | Admitting: Cardiology

## 2022-08-04 DIAGNOSIS — R011 Cardiac murmur, unspecified: Secondary | ICD-10-CM

## 2022-08-04 NOTE — Telephone Encounter (Signed)
Pt calling in to sch her echo that was ordered in 04/2022. Order has expired

## 2022-08-08 ENCOUNTER — Other Ambulatory Visit: Payer: BC Managed Care – PPO

## 2022-08-10 ENCOUNTER — Ambulatory Visit: Payer: BC Managed Care – PPO | Attending: Cardiology

## 2022-08-10 ENCOUNTER — Ambulatory Visit (INDEPENDENT_AMBULATORY_CARE_PROVIDER_SITE_OTHER): Payer: BC Managed Care – PPO | Admitting: Obstetrics & Gynecology

## 2022-08-10 ENCOUNTER — Encounter: Payer: Self-pay | Admitting: Obstetrics & Gynecology

## 2022-08-10 VITALS — BP 130/82 | HR 84 | Wt 152.0 lb

## 2022-08-10 DIAGNOSIS — A6004 Herpesviral vulvovaginitis: Secondary | ICD-10-CM

## 2022-08-10 DIAGNOSIS — R011 Cardiac murmur, unspecified: Secondary | ICD-10-CM

## 2022-08-10 MED ORDER — VALACYCLOVIR HCL 1 G PO TABS: 1000.0000 mg | ORAL_TABLET | Freq: Two times a day (BID) | ORAL | 3 refills | Status: AC

## 2022-08-10 NOTE — Progress Notes (Signed)
Chief Complaint  Patient presents with   Vaginal Itching    And burning for 6 days. Tried otc cream but didn't help.       51 y.o. [id number] No LMP recorded. Patient has had an implant. The current method of family planning is nexplanon.  Outpatient Encounter Medications as of 08/10/2022  Medication Sig   amLODipine (NORVASC) 10 MG tablet TAKE 1 TABLET BY MOUTH EVERY DAY FOR 90 DAYS   atorvastatin (LIPITOR) 10 MG tablet Take 10 mg by mouth daily.   lisinopril-hydrochlorothiazide (PRINZIDE,ZESTORETIC) 20-12.5 MG tablet Take 1 tablet by mouth daily.   metFORMIN (GLUCOPHAGE) 500 MG tablet Take 500 mg by mouth 2 (two) times daily.   valACYclovir (VALTREX) 1000 MG tablet Take 1 tablet (1,000 mg total) by mouth 2 (two) times daily.   amLODipine (NORVASC) 5 MG tablet Take 5 mg by mouth every morning. (Patient not taking: Reported on 04/24/2022)   dapagliflozin propanediol (FARXIGA) 10 MG TABS tablet Take 10 mg by mouth daily. (Patient not taking: Reported on 04/24/2022)   No facility-administered encounter medications on file as of 08/10/2022.    Subjective Pt with vaginal irritation for 1 week No discharge No recent antibiotics A1C is 5 Tender at the end of urination, not the beginning Past Medical History:  Diagnosis Date   Essential hypertension    Mixed hyperlipidemia    Type 2 diabetes mellitus ([place])     Past Surgical History:  Procedure Laterality Date   BREAST BIOPSY Left 03/22/2016   Procedure: BREAST BIOPSY WITH NEEDLE LOCALIZATION;  Surgeon: Aviva Signs, MD;  Location: [facility];  Service: General;  Laterality: Left;  interpreter scheduled - do NOT move   CESAREAN SECTION      OB History     Gravida  4   Para  2   Term  2   Preterm  0   AB  2   Living  3      SAB  2   IAB      Ectopic      Multiple  1   Live Births  3           No Known Allergies  Social History   Socioeconomic History   Marital status: Married    Spouse name: Not on  file   Number of children: 3   Years of education: Not on file   Highest education level: Not on file  Occupational History   Not on file  Tobacco Use   Smoking status: Never   Smokeless tobacco: Never  Vaping Use   Vaping Use: Never used  Substance and Sexual Activity   Alcohol use: No   Drug use: No   Sexual activity: Yes    Birth control/protection: Implant, Condom  Other Topics Concern   Not on file  Social History Narrative   Not on file   Social Determinants of Health   Financial Resource Strain: Low Risk  (12/16/2020)   Overall Financial Resource Strain (CARDIA)    Difficulty of Paying Living Expenses: Not very hard  Food Insecurity: No Food Insecurity (12/16/2020)   Hunger Vital Sign    Worried About Running Out of Food in the Last Year: Never true    Ran Out of Food in the Last Year: Never true  Transportation Needs: No Transportation Needs (12/16/2020)   PRAPARE - Hydrologist (Medical): No    Lack of Transportation (Non-Medical): No  Physical Activity:  Insufficiently Active (12/16/2020)   Exercise Vital Sign    Days of Exercise per Week: 3 days    Minutes of Exercise per Session: 30 min  Stress: No Stress Concern Present (12/16/2020)   [place]    Feeling of Stress : Not at all  Social Connections: Moderately Isolated (12/16/2020)   Social Connection and Isolation Panel [NHANES]    Frequency of Communication with Friends and Family: Once a week    Frequency of Social Gatherings with Friends and Family: Once a week    Attends Religious Services: 1 to 4 times per year    Active Member of [employer] or Organizations: No    Attends Music therapist: Never    Marital Status: Married    Family History  Problem Relation Age of Onset   Hypertension Mother    Hypertension Father    Hypertension Sister    Diabetes Brother     Medications:       Current  Outpatient Medications:    amLODipine (NORVASC) 10 MG tablet, TAKE 1 TABLET BY MOUTH EVERY DAY FOR 90 DAYS, Disp: , Rfl:    atorvastatin (LIPITOR) 10 MG tablet, Take 10 mg by mouth daily., Disp: , Rfl:    lisinopril-hydrochlorothiazide (PRINZIDE,ZESTORETIC) 20-12.5 MG tablet, Take 1 tablet by mouth daily., Disp: , Rfl:    metFORMIN (GLUCOPHAGE) 500 MG tablet, Take 500 mg by mouth 2 (two) times daily., Disp: , Rfl:    valACYclovir (VALTREX) 1000 MG tablet, Take 1 tablet (1,000 mg total) by mouth 2 (two) times daily., Disp: 20 tablet, Rfl: 3   amLODipine (NORVASC) 5 MG tablet, Take 5 mg by mouth every morning. (Patient not taking: Reported on 04/24/2022), Disp: , Rfl:    dapagliflozin propanediol (FARXIGA) 10 MG TABS tablet, Take 10 mg by mouth daily. (Patient not taking: Reported on 04/24/2022), Disp: , Rfl:   Objective Blood pressure 130/82, pulse 84, weight 152 lb (68.9 kg).  3 small ulcers noted on the left labia, HSV PCR testing done Likely HSV1 Vaginal exam is negative for discharge or abnormalities  No histroy of similar lesions  Pertinent ROS No burning with urination, frequency or urgency No nausea, vomiting or diarrhea Nor fever chills or other constitutional symptoms   Labs or studies Pending PCR    Impression + Management Plan: Diagnoses this Encounter::   ICD-10-CM   1. 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    Dispense:  20 tablet    Refill:  3    Labs or Scans Ordered during this encounter: Orders Placed This Encounter  Procedures   Herpes simplex virus culture      Follow up Return if symptoms worsen or fail to improve.

## 2022-08-11 LAB — ECHOCARDIOGRAM COMPLETE
AR max vel: 1.87 cm2
AV Peak grad: 14.7 mmHg
Ao pk vel: 1.92 m/s
Area-P 1/2: 3.6 cm2
Calc EF: 66.1 %
MV M vel: 2.12 m/s
MV Peak grad: 18 mmHg
S' Lateral: 2.31 cm
Single Plane A2C EF: 70.2 %
Single Plane A4C EF: 60.8 %
Weight: 2432 oz

## 2022-08-14 ENCOUNTER — Telehealth: Payer: Self-pay | Admitting: Obstetrics & Gynecology

## 2022-08-14 LAB — HERPES SIMPLEX VIRUS CULTURE

## 2022-08-14 NOTE — Telephone Encounter (Signed)
Patient is calling about lab results. Please advise.

## 2022-08-16 NOTE — Telephone Encounter (Signed)
Pt informed of results using [employer]. She would like to use daily supression. Advised I would let Dr. Elonda Husky know and he would send in meds.

## 2022-08-17 MED ORDER — VALACYCLOVIR HCL 500 MG PO TABS: 500.0000 mg | ORAL_TABLET | Freq: Every day | ORAL | 11 refills | Status: AC

## 2022-08-17 NOTE — Addendum Note (Signed)
Addended by: Florian Buff on: 08/17/2022 10:25 AM   Modules accepted: Orders

## 2022-10-11 IMAGING — MG MM DIGITAL SCREENING BILAT W/ TOMO AND CAD
6 of 12 series · 6 of 36 positions shown · non-contrast
Comparison: Previous exam(s).

CLINICAL DATA: Screening.

EXAM:
DIGITAL SCREENING BILATERAL MAMMOGRAM WITH TOMOSYNTHESIS AND CAD
TECHNIQUE: Bilateral screening digital craniocaudal and mediolateral oblique
mammograms were obtained. Bilateral screening digital breast
tomosynthesis was performed. The images were evaluated with
computer-aided detection.

[L MLO synth-2D (1 of 2)]
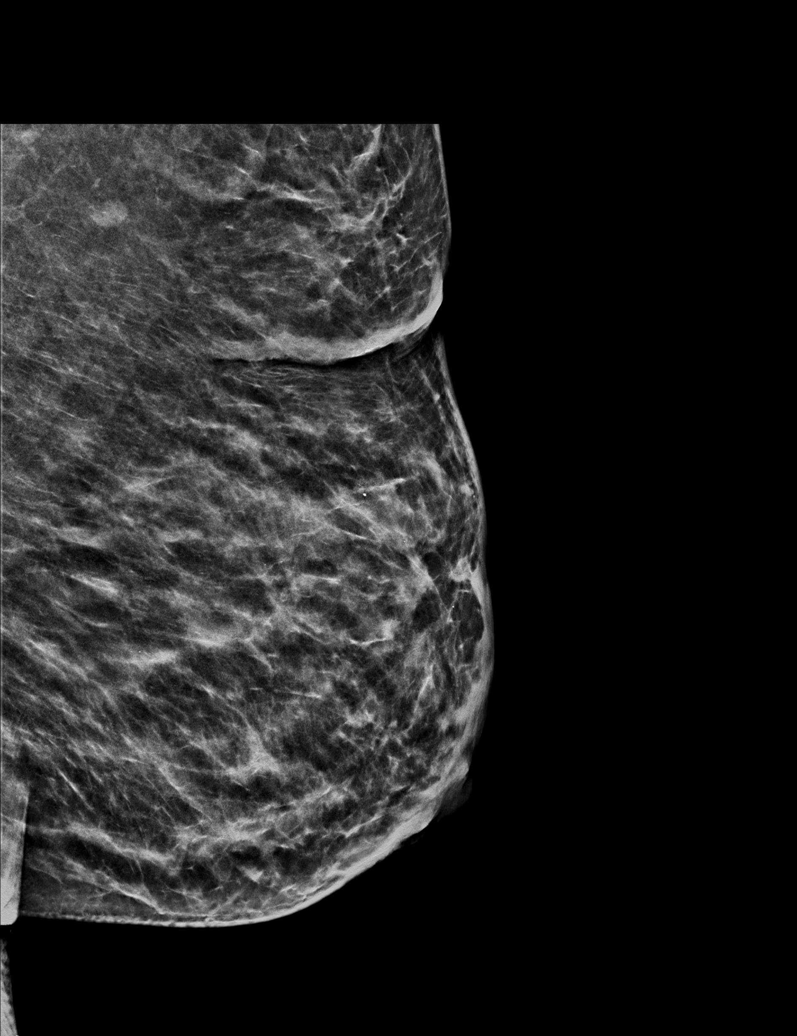

[R CC synth-2D]
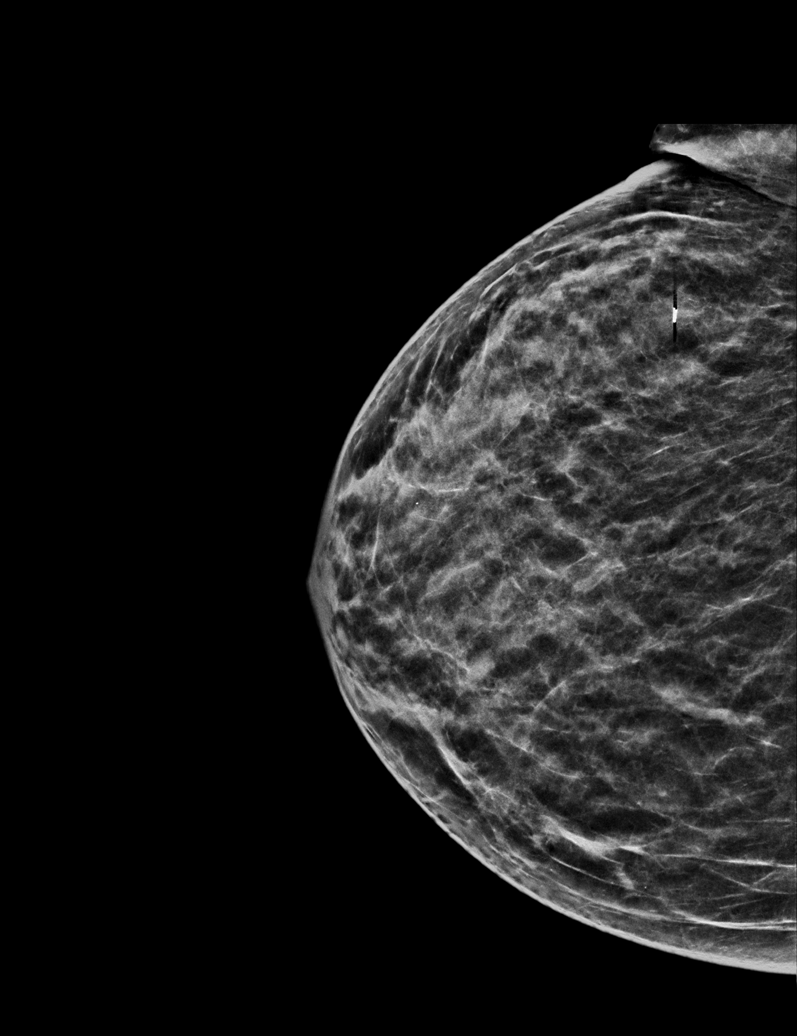

[L CC synth-2D]
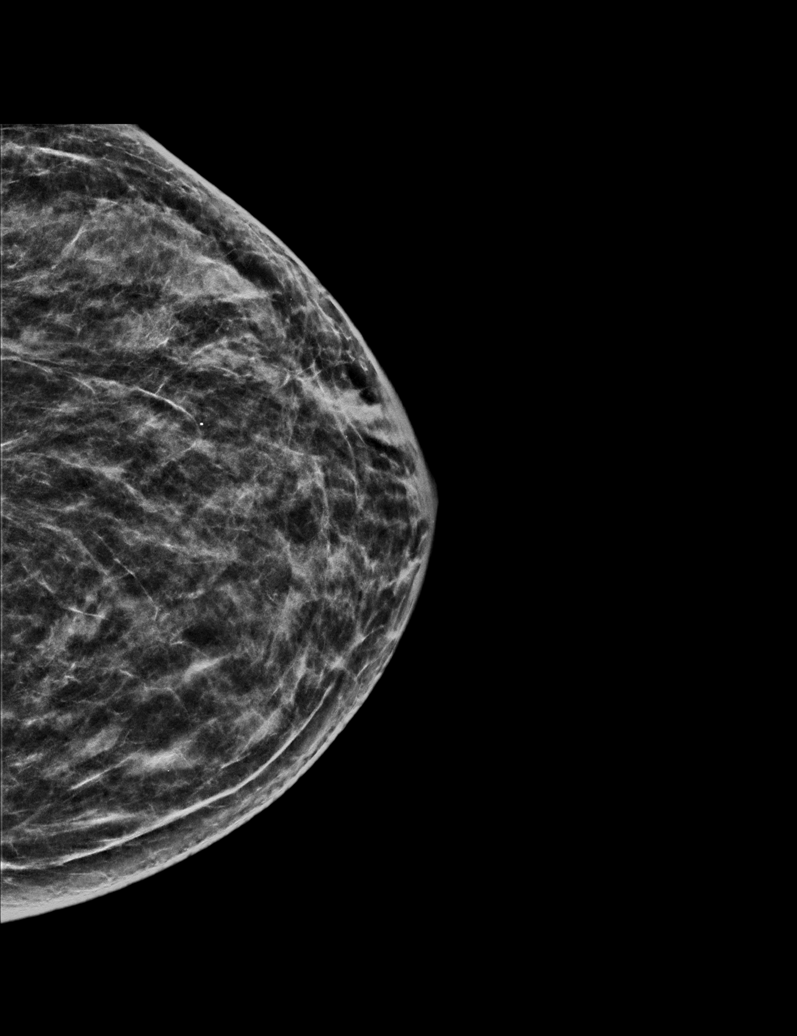

[L MLO synth-2D (2 of 2)]
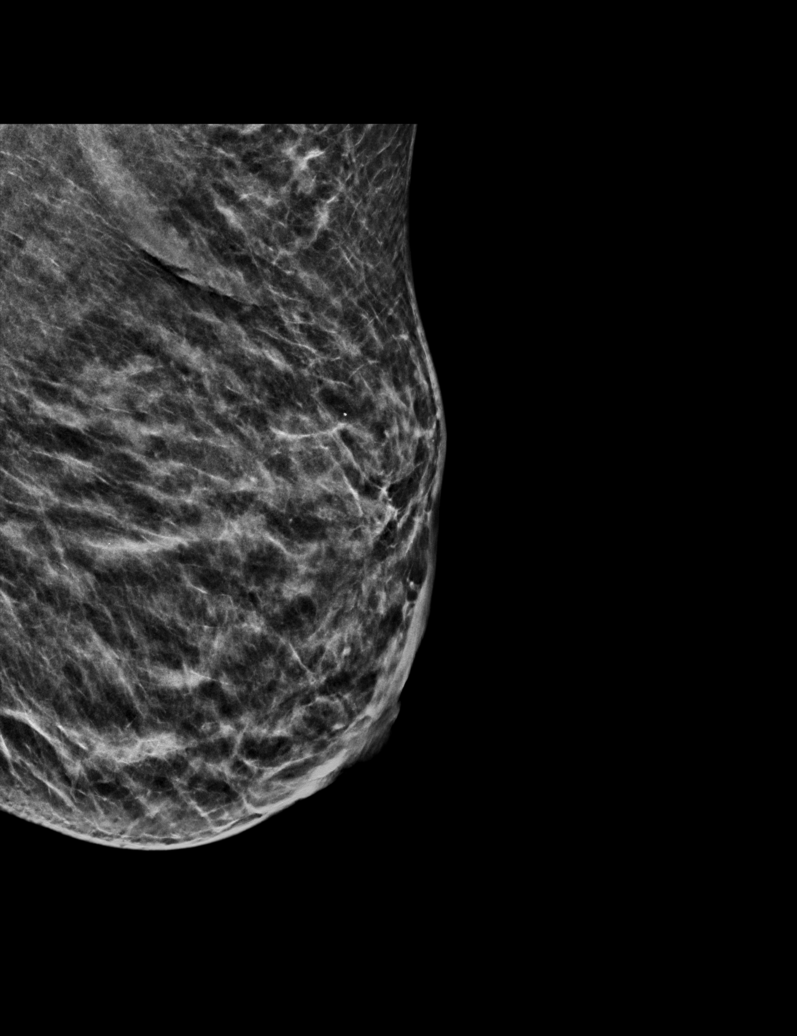

[R MLO synth-2D (1 of 2)]
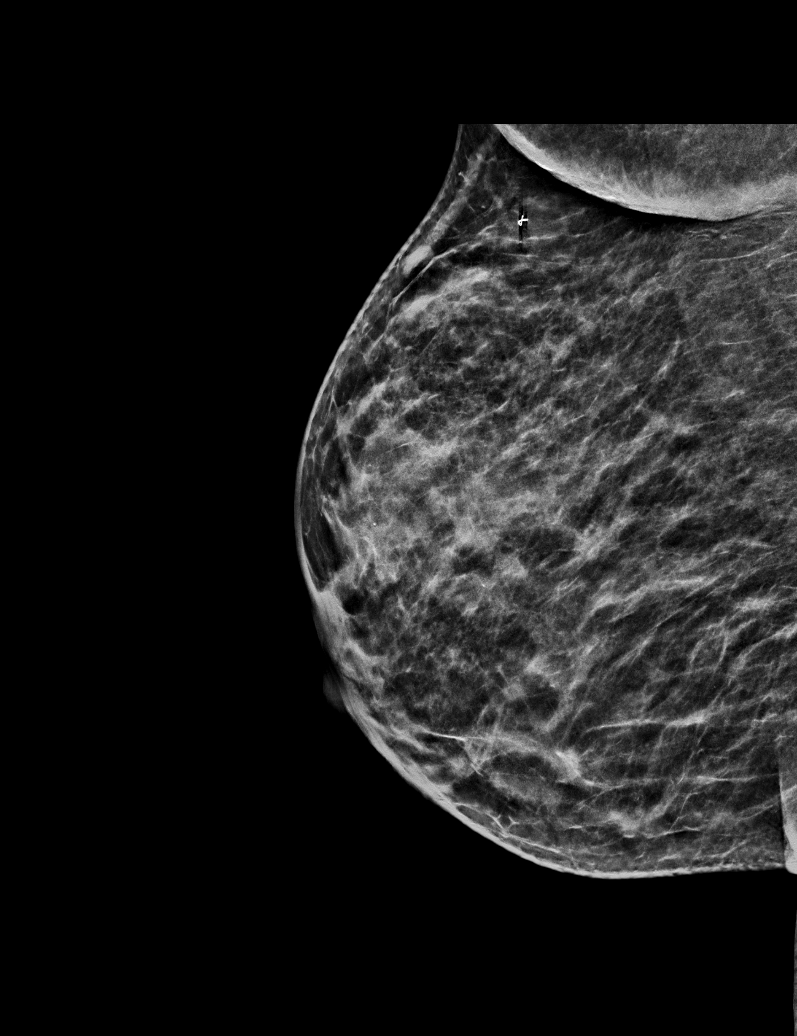

[R MLO synth-2D (2 of 2)]
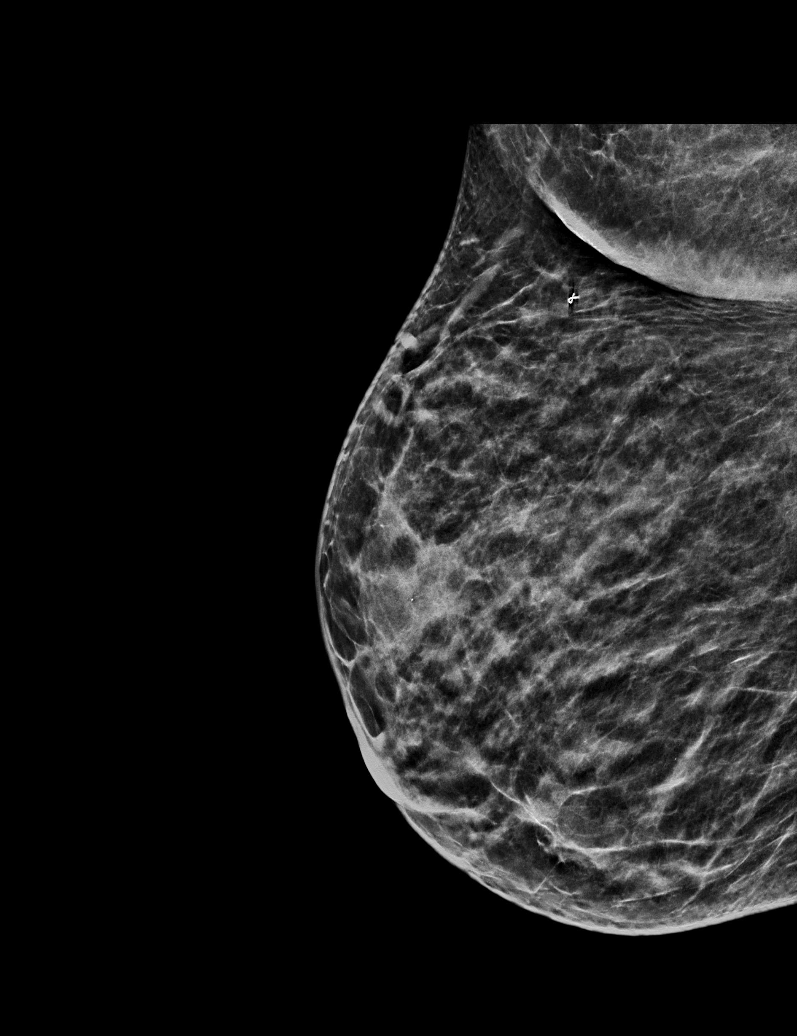

[6 of 36 positions shown; findings below may reference images not displayed]

ACR Breast Density Category c: The breast tissue is heterogeneously
dense, which may obscure small masses.
FINDINGS: There are no findings suspicious for malignancy.
IMPRESSION: No mammographic evidence of malignancy. A result letter of this
screening mammogram will be mailed directly to the patient.

RECOMMENDATION:
Screening mammogram in one year. (Code:Q3-W-BC3)

BI-RADS CATEGORY  1: Negative.

## 2022-11-13 ENCOUNTER — Encounter: Payer: Self-pay | Admitting: *Deleted

## 2022-12-11 ENCOUNTER — Other Ambulatory Visit (HOSPITAL_COMMUNITY): Payer: Self-pay | Admitting: Family Medicine

## 2022-12-11 DIAGNOSIS — Z1231 Encounter for screening mammogram for malignant neoplasm of breast: Secondary | ICD-10-CM

## 2023-01-10 ENCOUNTER — Ambulatory Visit (HOSPITAL_COMMUNITY)
Admission: RE | Admit: 2023-01-10 | Discharge: 2023-01-10 | Disposition: A | Discharge status: Home / Self Care | Payer: BC Managed Care – PPO | Source: Ambulatory Visit | Attending: Family Medicine | Admitting: Family Medicine

## 2023-01-10 ENCOUNTER — Encounter (HOSPITAL_COMMUNITY): Payer: Self-pay

## 2023-01-10 DIAGNOSIS — Z1231 Encounter for screening mammogram for malignant neoplasm of breast: Secondary | ICD-10-CM | POA: Diagnosis present

## 2023-12-19 ENCOUNTER — Other Ambulatory Visit (HOSPITAL_COMMUNITY): Payer: Self-pay | Admitting: Family Medicine

## 2023-12-19 DIAGNOSIS — Z1231 Encounter for screening mammogram for malignant neoplasm of breast: Secondary | ICD-10-CM

## 2024-01-03 ENCOUNTER — Ambulatory Visit: Admitting: Obstetrics & Gynecology

## 2024-01-03 ENCOUNTER — Other Ambulatory Visit (HOSPITAL_COMMUNITY)
Admission: RE | Admit: 2024-01-03 | Discharge: 2024-01-03 | Disposition: A | Discharge status: Home / Self Care | Source: Ambulatory Visit | Attending: Obstetrics & Gynecology | Admitting: Obstetrics & Gynecology

## 2024-01-03 VITALS — BP 132/77 | HR 84 | Ht 64.0 in | Wt 155.0 lb

## 2024-01-03 DIAGNOSIS — Z01419 Encounter for gynecological examination (general) (routine) without abnormal findings: Secondary | ICD-10-CM

## 2024-01-03 DIAGNOSIS — Z3046 Encounter for surveillance of implantable subdermal contraceptive: Secondary | ICD-10-CM | POA: Diagnosis not present

## 2024-01-03 MED ORDER — ETONOGESTREL 68 MG ~~LOC~~ IMPL
68.0000 mg | DRUG_IMPLANT | Freq: Once | SUBCUTANEOUS | Status: AC
  Administered 2024-01-03: 68 mg via SUBCUTANEOUS

## 2024-01-03 NOTE — Addendum Note (Signed)
 Addended by: Annamarie Dawley on: 01/03/2024 12:08 PM   Modules accepted: Orders

## 2024-01-03 NOTE — Progress Notes (Signed)
 Subjective:     Chelsea Massey is a 53 y.o. female here for a routine exam.  No LMP recorded. Patient has had an implant. [id number] Birth Control Method:  Nexplanon-->due to come out, wants removed + replaced Menstrual Calendar(currently): amenorrhea  Current complaints: none.   Current acute medical issues:  none   Recent Gynecologic History No LMP recorded. Patient has had an implant. Last Pap: 12/2020,  normal Last mammogram: 01/10/2023,  normal  Past Medical History:  Diagnosis Date   Essential hypertension    Mixed hyperlipidemia    Type 2 diabetes mellitus (HCC)     Past Surgical History:  Procedure Laterality Date   BREAST BIOPSY Left 03/22/2016   COMPLEX SCLEROSING LESION WITH USUAL DUCTAL HYPERPLASIA   CESAREAN SECTION      OB History     Gravida  4   Para  2   Term  2   Preterm  0   AB  2   Living  3      SAB  2   IAB      Ectopic      Multiple  1   Live Births  3           Social History   Socioeconomic History   Marital status: Married    Spouse name: Not on file   Number of children: 3   Years of education: Not on file   Highest education level: Not on file  Occupational History   Not on file  Tobacco Use   Smoking status: Never   Smokeless tobacco: Never  Vaping Use   Vaping status: Never Used  Substance and Sexual Activity   Alcohol use: No   Drug use: No   Sexual activity: Yes    Birth control/protection: Implant, Condom  Other Topics Concern   Not on file  Social History Narrative   Not on file   Social Drivers of Health   Financial Resource Strain: Low Risk  (12/16/2020)   Overall Financial Resource Strain (CARDIA)    Difficulty of Paying Living Expenses: Not very hard  Food Insecurity: No Food Insecurity (12/16/2020)   Hunger Vital Sign    Worried About Running Out of Food in the Last Year: Never true    Ran Out of Food in the Last Year: Never true  Transportation Needs: No Transportation Needs (12/16/2020)    PRAPARE - Administrator, Civil Service (Medical): No    Lack of Transportation (Non-Medical): No  Physical Activity: Insufficiently Active (12/16/2020)   Exercise Vital Sign    Days of Exercise per Week: 3 days    Minutes of Exercise per Session: 30 min  Stress: No Stress Concern Present (12/16/2020)   [employer] of Occupational Health - Occupational Stress Questionnaire    Feeling of Stress : Not at all  Social Connections: Moderately Isolated (12/16/2020)   Social Connection and Isolation Panel [NHANES]    Frequency of Communication with Friends and Family: Once a week    Frequency of Social Gatherings with Friends and Family: Once a week    Attends Religious Services: 1 to 4 times per year    Active Member of [employer] or Organizations: No    Attends Banker Meetings: Never    Marital Status: Married    Family History  Problem Relation Age of Onset   Hypertension Mother    Hypertension Father    Hypertension Sister    Diabetes Brother  Current Outpatient Medications:    amLODipine (NORVASC) 10 MG tablet, TAKE 1 TABLET BY MOUTH EVERY DAY FOR 90 DAYS, Disp: , Rfl:    lisinopril-hydrochlorothiazide (PRINZIDE,ZESTORETIC) 20-12.5 MG tablet, Take 1 tablet by mouth daily., Disp: , Rfl:    lovastatin (MEVACOR) 40 MG tablet, Take 40 mg by mouth at bedtime., Disp: , Rfl:    metFORMIN (GLUCOPHAGE) 500 MG tablet, Take 500 mg by mouth 2 (two) times daily., Disp: , Rfl:    amLODipine (NORVASC) 5 MG tablet, Take 5 mg by mouth every morning. (Patient not taking: Reported on 01/03/2024), Disp: , Rfl:    atorvastatin (LIPITOR) 10 MG tablet, Take 10 mg by mouth daily. (Patient not taking: Reported on 01/03/2024), Disp: , Rfl:    dapagliflozin propanediol (FARXIGA) 10 MG TABS tablet, Take 10 mg by mouth daily. (Patient not taking: Reported on 01/03/2024), Disp: , Rfl:    valACYclovir (VALTREX) 1000 MG tablet, Take 1 tablet (1,000 mg total) by mouth 2 (two) times  daily. (Patient not taking: Reported on 01/03/2024), Disp: 20 tablet, Rfl: 3   valACYclovir (VALTREX) 500 MG tablet, Take 1 tablet (500 mg total) by mouth daily. (Patient not taking: Reported on 01/03/2024), Disp: 30 tablet, Rfl: 11  Review of Systems  Review of Systems  Constitutional: Negative for fever, chills, weight loss, malaise/fatigue and diaphoresis.  HENT: Negative for hearing loss, ear pain, nosebleeds, congestion, sore throat, neck pain, tinnitus and ear discharge.   Eyes: Negative for blurred vision, double vision, photophobia, pain, discharge and redness.  Respiratory: Negative for cough, hemoptysis, sputum production, shortness of breath, wheezing and stridor.   Cardiovascular: Negative for chest pain, palpitations, orthopnea, claudication, leg swelling and PND.  Gastrointestinal: negative for abdominal pain. Negative for heartburn, nausea, vomiting, diarrhea, constipation, blood in stool and melena.  Genitourinary: Negative for dysuria, urgency, frequency, hematuria and flank pain.  Musculoskeletal: Negative for myalgias, back pain, joint pain and falls.  Skin: Negative for itching and rash.  Neurological: Negative for dizziness, tingling, tremors, sensory change, speech change, focal weakness, seizures, loss of consciousness, weakness and headaches.  Endo/Heme/Allergies: Negative for environmental allergies and polydipsia. Does not bruise/bleed easily.  Psychiatric/Behavioral: Negative for depression, suicidal ideas, hallucinations, memory loss and substance abuse. The patient is not nervous/anxious and does not have insomnia.        Objective:  Blood pressure 132/77, pulse 84, height 5\' 4"  (1.626 m), weight 155 lb (70.3 kg).   Physical Exam  Vitals reviewed. Constitutional: She is oriented to person, place, and time. She appears well-developed and well-nourished.  HENT:  Head: Normocephalic and atraumatic.        Right Ear: External ear normal.  Left Ear: External ear  normal.  Nose: Nose normal.  Mouth/Throat: Oropharynx is clear and moist.  Eyes: Conjunctivae and EOM are normal. Pupils are equal, round, and reactive to light. Right eye exhibits no discharge. Left eye exhibits no discharge. No scleral icterus.  Neck: Normal range of motion. Neck supple. No tracheal deviation present. No thyromegaly present.  Cardiovascular: Normal rate, regular rhythm, normal heart sounds and intact distal pulses.  Exam reveals no gallop and no friction rub.   No murmur heard. Respiratory: Effort normal and breath sounds normal. No respiratory distress. She has no wheezes. She has no rales. She exhibits no tenderness.  GI: Soft. Bowel sounds are normal. She exhibits no distension and no mass. There is no tenderness. There is no rebound and no guarding.  Genitourinary:  Breasts no masses skin changes or nipple  changes bilaterally      Vulva is normal without lesions Vagina is pink moist without discharge Cervix normal in appearance and pap is done Uterus is normal size shape and contour Adnexa is negative with normal sized ovaries   Musculoskeletal: Normal range of motion. She exhibits no edema and no tenderness.  Neurological: She is alert and oriented to person, place, and time. She has normal reflexes. She displays normal reflexes. No cranial nerve deficit. She exhibits normal muscle tone. Coordination normal.  Skin: Skin is warm and dry. No rash noted. No erythema. No pallor.  Psychiatric: She has a normal mood and affect. Her behavior is normal. Judgment and thought content normal.       Medications Ordered at today's visit: No orders of the defined types were placed in this encounter.   Other orders placed at today's visit: No orders of the defined types were placed in this encounter.   Procedure note: removal The left arm is prepped in usual sterile fashion Received 3 cc of half percent Marcaine is injected at the site of the Nexplanon insertion The  Nexplanon is palpated without difficulty A small stab incision is made with an 11 blade Curved hemostats were used and the Nexplanon rod was removed without difficulty    reinsertion The new sterile Nexplanon device is removed from the packaging The Nexplanon is inserted without difficulty It is palpated by both me and the patient Hemostasis is achieved Peri-Strips were placed in elastic bandage placed which remain in place for 2 days     ASSESSMENT + PLAN:    ICD-10-CM   1. Well woman exam with routine gynecological exam  Z01.419     2. Encounter for cervical Pap smear with pelvic exam  Z01.419     3. Encounter for removal and reinsertion of Nexplanon  Z30.46           No follow-ups on file.

## 2024-01-07 LAB — CYTOLOGY - PAP
Comment: NEGATIVE
Diagnosis: NEGATIVE
High risk HPV: NEGATIVE

## 2024-01-14 ENCOUNTER — Ambulatory Visit (HOSPITAL_COMMUNITY)
Admission: RE | Admit: 2024-01-14 | Discharge: 2024-01-14 | Disposition: A | Discharge status: Home / Self Care | Source: Ambulatory Visit | Attending: Family Medicine | Admitting: Family Medicine

## 2024-01-14 DIAGNOSIS — Z1231 Encounter for screening mammogram for malignant neoplasm of breast: Secondary | ICD-10-CM | POA: Diagnosis present
